# Patient Record
Sex: Female | Born: 1990 | Race: White | Hispanic: No | Marital: Single | Smoking: Never smoker
Health system: Southern US, Community
[De-identification: ages and names within clinical notes are randomized; demographics above are authoritative.]

## PROBLEM LIST (undated history)

## (undated) ENCOUNTER — Inpatient Hospital Stay (HOSPITAL_COMMUNITY): Payer: Self-pay

## (undated) DIAGNOSIS — F329 Major depressive disorder, single episode, unspecified: Secondary | ICD-10-CM

## (undated) DIAGNOSIS — Z803 Family history of malignant neoplasm of breast: Secondary | ICD-10-CM

## (undated) DIAGNOSIS — F32A Depression, unspecified: Secondary | ICD-10-CM

## (undated) DIAGNOSIS — F419 Anxiety disorder, unspecified: Secondary | ICD-10-CM

## (undated) DIAGNOSIS — J45909 Unspecified asthma, uncomplicated: Secondary | ICD-10-CM

## (undated) HISTORY — PX: WISDOM TOOTH EXTRACTION: SHX21

## (undated) HISTORY — DX: Family history of malignant neoplasm of breast: Z80.3

---

## 2012-07-03 DIAGNOSIS — J45909 Unspecified asthma, uncomplicated: Secondary | ICD-10-CM | POA: Insufficient documentation

## 2016-11-19 LAB — OB RESULTS CONSOLE HEPATITIS B SURFACE ANTIGEN: HEP B S AG: NEGATIVE

## 2016-11-19 LAB — OB RESULTS CONSOLE ABO/RH: RH Type: POSITIVE

## 2016-11-19 LAB — OB RESULTS CONSOLE RPR: RPR: NONREACTIVE

## 2016-11-19 LAB — OB RESULTS CONSOLE GC/CHLAMYDIA
Chlamydia: NEGATIVE
GC PROBE AMP, GENITAL: NEGATIVE

## 2016-11-19 LAB — OB RESULTS CONSOLE HIV ANTIBODY (ROUTINE TESTING): HIV: NONREACTIVE

## 2016-11-19 LAB — OB RESULTS CONSOLE RUBELLA ANTIBODY, IGM: RUBELLA: IMMUNE

## 2016-11-19 LAB — OB RESULTS CONSOLE ANTIBODY SCREEN: ANTIBODY SCREEN: NEGATIVE

## 2016-11-25 ENCOUNTER — Other Ambulatory Visit: Payer: Self-pay

## 2016-12-11 ENCOUNTER — Other Ambulatory Visit: Payer: Self-pay

## 2016-12-11 ENCOUNTER — Encounter (HOSPITAL_COMMUNITY): Payer: Self-pay

## 2016-12-13 ENCOUNTER — Other Ambulatory Visit (HOSPITAL_COMMUNITY): Payer: Self-pay | Admitting: Obstetrics and Gynecology

## 2016-12-13 DIAGNOSIS — Z3A14 14 weeks gestation of pregnancy: Secondary | ICD-10-CM

## 2016-12-13 DIAGNOSIS — D181 Lymphangioma, any site: Secondary | ICD-10-CM

## 2016-12-13 DIAGNOSIS — Z3689 Encounter for other specified antenatal screening: Secondary | ICD-10-CM

## 2016-12-16 ENCOUNTER — Encounter (HOSPITAL_COMMUNITY): Payer: Self-pay | Admitting: Obstetrics and Gynecology

## 2016-12-20 ENCOUNTER — Encounter (HOSPITAL_COMMUNITY): Payer: Self-pay | Admitting: *Deleted

## 2016-12-24 ENCOUNTER — Ambulatory Visit (HOSPITAL_COMMUNITY)
Admission: RE | Admit: 2016-12-24 | Discharge: 2016-12-24 | Disposition: A | Discharge status: Home / Self Care | Payer: BLUE CROSS/BLUE SHIELD | Source: Ambulatory Visit | Attending: Obstetrics and Gynecology | Admitting: Obstetrics and Gynecology

## 2016-12-24 ENCOUNTER — Encounter (HOSPITAL_COMMUNITY): Payer: Self-pay

## 2016-12-24 DIAGNOSIS — Z3A14 14 weeks gestation of pregnancy: Secondary | ICD-10-CM | POA: Diagnosis not present

## 2016-12-24 DIAGNOSIS — O283 Abnormal ultrasonic finding on antenatal screening of mother: Secondary | ICD-10-CM

## 2016-12-24 DIAGNOSIS — Z3689 Encounter for other specified antenatal screening: Secondary | ICD-10-CM

## 2016-12-24 DIAGNOSIS — O09892 Supervision of other high risk pregnancies, second trimester: Secondary | ICD-10-CM | POA: Insufficient documentation

## 2016-12-24 DIAGNOSIS — D181 Lymphangioma, any site: Secondary | ICD-10-CM

## 2016-12-24 DIAGNOSIS — O98412 Viral hepatitis complicating pregnancy, second trimester: Secondary | ICD-10-CM | POA: Diagnosis not present

## 2016-12-24 DIAGNOSIS — F1990 Other psychoactive substance use, unspecified, uncomplicated: Secondary | ICD-10-CM | POA: Diagnosis not present

## 2016-12-24 DIAGNOSIS — Z363 Encounter for antenatal screening for malformations: Secondary | ICD-10-CM | POA: Diagnosis not present

## 2016-12-24 DIAGNOSIS — B182 Chronic viral hepatitis C: Secondary | ICD-10-CM | POA: Diagnosis not present

## 2016-12-24 DIAGNOSIS — O99322 Drug use complicating pregnancy, second trimester: Secondary | ICD-10-CM | POA: Diagnosis present

## 2016-12-24 DIAGNOSIS — O358XX0 Maternal care for other (suspected) fetal abnormality and damage, not applicable or unspecified: Secondary | ICD-10-CM | POA: Insufficient documentation

## 2016-12-24 HISTORY — DX: Depression, unspecified: F32.A

## 2016-12-24 HISTORY — DX: Anxiety disorder, unspecified: F41.9

## 2016-12-24 HISTORY — DX: Unspecified asthma, uncomplicated: J45.909

## 2016-12-24 HISTORY — DX: Major depressive disorder, single episode, unspecified: F32.9

## 2016-12-26 DIAGNOSIS — Z3A14 14 weeks gestation of pregnancy: Secondary | ICD-10-CM | POA: Insufficient documentation

## 2016-12-26 DIAGNOSIS — O283 Abnormal ultrasonic finding on antenatal screening of mother: Secondary | ICD-10-CM | POA: Insufficient documentation

## 2016-12-26 NOTE — Progress Notes (Signed)
Genetic Counseling  High-Risk Gestation Note  Appointment Date:  12/24/2016 Referred By: Tyson Dense, * Date of Birth:  1991-03-16 Partner:  Gaye Alken   Pregnancy History: G1P0 Estimated Date of Delivery: 06/21/17 Estimated Gestational Age: 70w3dAttending: PBenjaman Lobe MD   I met with Ms. Deborah Stiverand her partner, Mr. JGaye Alken for genetic counseling because of abnormal ultrasound findings. The patient's mother also accompanied her to today's visit.    In summary:  Discussed ultrasound findings in detail: NT measurement increased today (5.2 mm), no hydrops visualized today  Reviewed associations with fetal aneuploidy, other chromosome aberrations, single gene conditions, structural defects, variant of normal  Reviewed options for additional screening  NIPS- Panorama performed through her OB office, results pending at time of today's appointment  [facility]ch[facility]- [facility]  Ongoing ultrasound- follow-up scheduled 01/14/17  NIPS single gene panel ([facility])- declined at this time, patient may consider pending Panorama result  Expanded pan-ethnic carrier screening panel- declined at this time, may consider after Panorama result obtained  Reviewed options for diagnostic testing, including risks, benefits, limitations and alternatives- declined amniocentesis at this time  Reviewed other explanations for ultrasound findings  Reviewed family history concerns  We began by reviewing the ultrasound in detail. The patient had a nuchal translucency (NT) ultrasound at the referring provider's office, which revealed an increased NT measurement/small cystic hygroma. Follow-up ultrasound performed today visualized increased NT measurement of 5.2 mm. It does not extend the full length of the fetal back; some possible septations were visualized. No evidence of hydrops visualized today. Complete ultrasound report under separate cover.    We discussed that the fetal NT refers to a fluid filled space between the skin and soft tissues behind the cervical spine. This space is traditionally measurable between 11 and 13.[redacted] weeks gestation and is considered enlarged when the measurement is equal to or greater than the 95th percentile for the gestational age. This couple was counseled regarding the various common etiologies for an enlarged NT including: aneuploidy, single gene conditions, cardiac or great vessel abnormalities, lymphatic system failure, decreased fetal movement, and fetal anemia.   We reviewed chromosomes, nondisjunction, and the common features and prognoses of Down syndrome and other aneuploidies. Considering the specific NT measurement, the risk for fetal aneuploidy is estimated to be approximately 33%. They were then counseled regarding the availability of aneuploidy screening options. The patient had noninvasive prenatal screening (NIPS)/prenatal cell free DNA testing performed through her OB office. We reviewed the methodology of this testing, Panorama through [facility] the conditions for which it assesses and detection rates and false positive rates of each. The results are currently pending. We reviewed that while highly sensitive and specific, it is not diagnostic and does not assess for all chromosome conditions.   We spent time discussing the chance for other chromosome aberrations, such as deletions and duplications. We discussed the diagnostic testing option of amniocentesis for karyotype and/or chromosome microarray analysis, including the risks, benefits, and limitations. We reviewed the associated risks for complications, including spontaneous pregnancy loss.   We also discussed single gene conditions. They were counseled that an increased NT is associated with an increased chance for specific single gene conditions including Noonan spectrum disorders, skeletal dysplasias, SLOS, CdLS, and many others. Single gene  conditions can follow various patterns of inheritance including autosomal recessive, X-linked, autosomal dominant and occur sporadically due to de novo gene variants. We discussed that these conditions are not routinely tested for prenatally unless ultrasound  findings or family history significantly increase the suspicion of a specific single gene disorder; however, there is new noninvasive prenatal screening (NIPS) technology ([facility] through [employer]) which assesses for specific alterations in 30 genes (covering 26 single gene conditions), including the genes associated with Noonan syndrome, some skeletal dysplasias, and CdLS. Most of these conditions follow an autosomal dominant pattern of inheritance and typically occur due to de novo gene mutations. The detection rates for these conditions vary depending upon the specific condition but range from 43% to 96%. Therefore, this screening would not identify all new dominant gene mutations.  We reviewed the benefits, limitations, and cost of this technology. They understand that many of the features of these conditions can be detected by detailed ultrasound during the second trimester; however, ultrasound cannot definitively diagnose or rule out these conditions. We briefly reviewed common inheritance patterns (dominant, recessive, and X-linked) as well as the associated risks of recurrence.   Regarding single gene conditions, we also discussed the more indirect screening option of expanded pan-ethnic carrier screening for a select panel of autosomal recessive and X-linked conditions. We reviewed that ACOG currently recommends that all patients be offered carrier screening for cystic fibrosis, spinal muscular atrophy and hemoglobinopathies. In addition, they were counseled that there are a variety of genetic screening laboratories that have pan-ethnic, or expanded, carrier screening panels, which evaluate carrier status for a wide range of genetic  conditions. We discussed that some of the conditions included on the panel, such as SLOS, can be associated with increased NT, while other conditions on the panel would not necessarily be related to the specific ultrasound findings in the pregnancy. Some of these conditions are severe and actionable, but also rare; others occur more commonly, but are less severe. We discussed that testing options range from screening for a single condition to panels of more than 175 autosomal or X-linked genetic conditions. We reviewed that the prevalence of each condition varies (and often varies with ethnicity). Thus the couples' background risk to be a carrier for each of these various conditions would range, and in some cases be very low or unknown. Similarly, the detection rate varies with each condition and also varies in some cases with ethnicity. We reviewed that a negative carrier screen would thus reduce, but not eliminate the chance to be a carrier for these conditions. For some conditions included on specific pan-ethnic carrier screening panels, the pre-test carrier frequency and/or the detection rate is unknown. We reviewed that in the event that one partner is found to be a carrier for one or more conditions, carrier screening would be available to the partner for those conditions. When both parents are identified to be carriers of a condition, prenatal diagnosis via amniocentesis would be available.   We discussed the possible results that the tests might provide including: positive, negative, unanticipated, and no result. Finally, they were counseled regarding the cost of each option and potential out of pocket expenses. After consideration of all the options, Ms. Dame declined amniocentesis, [facility] (single gene NIPS), and expanded pan-ethnic carrier screening panel (including ACOG recommended screening) at this time. She indicated that she would possibly consider pursuing these additional screens pending the  results of Panorama and planned to follow-up with her OB once these results are available.   In addition, we discussed that an NT of 5.2 mm is associated with an approximate 7% risk for a fetal cardiac anomaly. We discussed the options of a fetal echocardiogram and detailed anatomy ultrasound as  methods to evaluate the fetal heart. A fetal echocardiogram was ordered today and scheduled for 02/14/17 with [facility]. Detailed ultrasound is scheduled for 01/14/17.   We then discussed that an increased NT value can be a normal variant, which can resolve during the pregnancy. They were counseled that the fetal prognosis depends on the underlying etiology of the enlarged NT and further anticipatory guidance can be provided if a diagnosis is discovered. They understand that the legal limit for TAB in [place] is [redacted] weeks gestation.  Both family histories were reviewed and found to be noncontributory for birth defects, intellectual disability, and known genetic conditions. Deborah Hansen does not have information regarding her extended paternal family history, given that her father was adopted. Mr. Gershon Cull had to leave the appointment to go to work today prior to obtaining detailed family history information from him.  Without further information regarding the provided family history, an accurate genetic risk cannot be calculated. Further genetic counseling is warranted if more information is obtained.  Ms. Giliana Vantil was provided with written information regarding cystic fibrosis (CF), spinal muscular atrophy (SMA) and hemoglobinopathies including the carrier frequency, availability of carrier screening and prenatal diagnosis if indicated.  In addition, we discussed that CF and hemoglobinopathies are routinely screened for as part of the [place] newborn screening panel.  After further discussion, she declined screening for CF, SMA and hemoglobinopathies today.   Ms. Evyn Kooyman denied exposure  to environmental toxins or chemical agents. She denied the use of alcohol, tobacco or street drugs. She denied significant viral illnesses during the course of her pregnancy. 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 She also take methadone. The patient reported that all of these medications are monitored by various health professionals. Use of Klonopin (clonazepam) during pregnancy is not anticipated to increase the risk for birth defects according to available animal studies and human pregnancy experience. Risk of mild transient neonatal complications may be increased when clonazepam is taken in combination with selective serotonin reuptake inhibitors. Experimental animal studies and limited human reports regarding prenatal use of cymbalta (duloxetine) do not indicate an increased risk for birth defects above the general population risk. Use of serotonin reuptake inhibitors in the third trimester have been associated with an increased risk for transient neonatal syndrome of central nervous system including irritability, vomiting, jitteriness and convulsions, as well as neonatal pulmonary hypertension.  These associated symptoms typically do not appear to occur frequently or severely.  She should not alter her medication use without consulting with her physician first.  I counseled this couple regarding the above risks and available options. Most of the counseling was provided by Noland Fordyce, [facility] genetic counseling student, under my direct supervision. The approximate face-to-face time with the genetic counselor was 50 minutes.  Chipper Oman, MS Certified Genetic Counselor 12/26/2016   Addendum: Lucina Mellow results were received on 12/26/2016 from the patient's primary OB office. These are within normal limits, showing a less than 1 in 10,000 risk for trisomies 21, 18 and 13, and monosomy X (Turner  syndrome).  In addition, the risk for triploidy and sex chromosome trisomies (47,XXX and 47,XXY) was also low risk.  This testing identifies > 99% of pregnancies with trisomy 72, trisomy 41, sex chromosome trisomies (47,XXX and 47,XXY), and triploidy. The detection rate for trisomy 18 is 96%.  The detection rate for monosomy X is ~92%.  The false positive rate is <0.1%  for all conditions. Testing was also consistent with female fetal sex. The report indicates that the patient's OB communicated these results to her.

## 2017-01-02 ENCOUNTER — Other Ambulatory Visit (HOSPITAL_COMMUNITY): Payer: Self-pay | Admitting: *Deleted

## 2017-01-02 DIAGNOSIS — O283 Abnormal ultrasonic finding on antenatal screening of mother: Secondary | ICD-10-CM

## 2017-01-14 ENCOUNTER — Ambulatory Visit (HOSPITAL_COMMUNITY)
Admission: RE | Admit: 2017-01-14 | Discharge: 2017-01-14 | Disposition: A | Discharge status: Home / Self Care | Payer: BLUE CROSS/BLUE SHIELD | Source: Ambulatory Visit | Attending: Obstetrics and Gynecology | Admitting: Obstetrics and Gynecology

## 2017-01-14 ENCOUNTER — Encounter (HOSPITAL_COMMUNITY): Payer: Self-pay

## 2017-01-14 ENCOUNTER — Other Ambulatory Visit (HOSPITAL_COMMUNITY): Payer: Self-pay | Admitting: Maternal and Fetal Medicine

## 2017-01-14 DIAGNOSIS — Z3A17 17 weeks gestation of pregnancy: Secondary | ICD-10-CM

## 2017-01-14 DIAGNOSIS — O283 Abnormal ultrasonic finding on antenatal screening of mother: Secondary | ICD-10-CM | POA: Diagnosis present

## 2017-02-04 ENCOUNTER — Telehealth (HOSPITAL_COMMUNITY): Payer: Self-pay | Admitting: MS"

## 2017-02-04 NOTE — Telephone Encounter (Signed)
Attempted to contact patient regarding results of single gene panel NIPS performed ([facility]), which was within normal limits. Left message for patient to return call.   Santiago Glad Charo Philipp 02/04/2017 3:52 PM

## 2017-02-07 ENCOUNTER — Telehealth (HOSPITAL_COMMUNITY): Payer: Self-pay | Admitting: MS"

## 2017-02-07 NOTE — Telephone Encounter (Signed)
[name] returned call. Patient identified by name and DOB. Discussed with Ms. Luwana Butrick that I was calling regarding results of [facility], single gene noninvasive prenatal screening that was drawn given abnormal ultrasound findings. We discussed that this was within normal limits, negative for known pathogenic or likely pathogenic variants in the panel of 30 selected genes. The conditions selected for this panel are autosomal dominant or X-linked conditions that typically occur due to de novo gene variations. We reviewed that the detection rate ranges from 43% to greater than 96%, depending upon the specific condition and specific gene. Thus, this screen does not diagnose or rule out all cases of the genetic conditions included on this panel. The patient understands that it also does not screen for all genetic conditions. All questions answered to her satisfaction at this time.   Santiago Glad Argel Pablo 02/07/2017 4:02 PM

## 2017-02-10 ENCOUNTER — Other Ambulatory Visit: Payer: Self-pay

## 2017-02-11 ENCOUNTER — Other Ambulatory Visit (HOSPITAL_COMMUNITY): Payer: Self-pay | Admitting: Obstetrics and Gynecology

## 2017-02-11 DIAGNOSIS — O350XX0 Maternal care for (suspected) central nervous system malformation in fetus, not applicable or unspecified: Secondary | ICD-10-CM

## 2017-02-11 DIAGNOSIS — O3503X0 Maternal care for (suspected) central nervous system malformation or damage in fetus, choroid plexus cysts, not applicable or unspecified: Secondary | ICD-10-CM

## 2017-02-11 DIAGNOSIS — Z3A22 22 weeks gestation of pregnancy: Secondary | ICD-10-CM

## 2017-02-11 DIAGNOSIS — O358XX0 Maternal care for other (suspected) fetal abnormality and damage, not applicable or unspecified: Secondary | ICD-10-CM

## 2017-02-14 ENCOUNTER — Encounter (HOSPITAL_COMMUNITY): Payer: Self-pay

## 2017-02-14 ENCOUNTER — Ambulatory Visit (HOSPITAL_COMMUNITY)
Admission: RE | Admit: 2017-02-14 | Discharge: 2017-02-14 | Disposition: A | Discharge status: Home / Self Care | Payer: BLUE CROSS/BLUE SHIELD | Source: Ambulatory Visit | Attending: Obstetrics and Gynecology | Admitting: Obstetrics and Gynecology

## 2017-02-21 ENCOUNTER — Encounter (HOSPITAL_COMMUNITY): Payer: Self-pay

## 2017-05-26 ENCOUNTER — Inpatient Hospital Stay (HOSPITAL_COMMUNITY)
Admission: AD | Admit: 2017-05-26 | Discharge: 2017-05-26 | Disposition: A | Discharge status: Other Acute Inpatient Hospital | Payer: BLUE CROSS/BLUE SHIELD | Source: Ambulatory Visit | Attending: Obstetrics and Gynecology | Admitting: Obstetrics and Gynecology

## 2017-05-26 DIAGNOSIS — R030 Elevated blood-pressure reading, without diagnosis of hypertension: Secondary | ICD-10-CM | POA: Diagnosis not present

## 2017-05-26 DIAGNOSIS — Q249 Congenital malformation of heart, unspecified: Secondary | ICD-10-CM | POA: Insufficient documentation

## 2017-05-26 DIAGNOSIS — F1120 Opioid dependence, uncomplicated: Secondary | ICD-10-CM | POA: Insufficient documentation

## 2017-05-26 DIAGNOSIS — O358XX0 Maternal care for other (suspected) fetal abnormality and damage, not applicable or unspecified: Secondary | ICD-10-CM | POA: Insufficient documentation

## 2017-05-26 DIAGNOSIS — O26893 Other specified pregnancy related conditions, third trimester: Secondary | ICD-10-CM | POA: Insufficient documentation

## 2017-05-26 DIAGNOSIS — O99323 Drug use complicating pregnancy, third trimester: Secondary | ICD-10-CM | POA: Diagnosis not present

## 2017-05-26 DIAGNOSIS — Z3A36 36 weeks gestation of pregnancy: Secondary | ICD-10-CM | POA: Insufficient documentation

## 2017-05-26 NOTE — MAU Note (Signed)
G1P0 at 36.2 weeks co regular ctx q 10 min.  Possible LOF, positive FM, no VB.  Pt reports 2 increase BP in OB office, Md had had discussion on possible IOL with pt.  NO other medical or OB concerns.

## 2017-05-26 NOTE — MAU Note (Signed)
Urine in the lab  

## 2017-05-26 NOTE — H&P (Signed)
Deborah Hansen is a 26 y.o. female G1 @ 36+4 wks presenting with c/o contractions.  Contractions began last night but increased today.  Did c/o leaking fluid but fern slide negaitve per RN report.   No VB.   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   OB History    Gravida Para Term Preterm AB Living   1             SAB TAB Ectopic Multiple Live Births                 Past Medical History:  Diagnosis Date  . Anxiety   . Asthma   . Depression    Past Surgical History:  Procedure Laterality Date  . WISDOM TOOTH EXTRACTION     Family History: family history is not on file. Social History:  reports that she has never smoked. She has never used smokeless tobacco. She reports that she drinks alcohol. She reports that she uses drugs.     Maternal Diabetes: No - but pt refused 1hr gtt Maternal Ultrasounds/Referrals: Abnormal:  Findings:   Fetal Heart Anomalies Fetal Ultrasounds or other Referrals:  Fetal echo Maternal Substance Abuse:  Yes:  Type: Methadone Significant Maternal Medications:  Meds include: Other: cymbalta Significant Maternal Lab Results:  None Other Comments:  None  ROS History Dilation: 4.5 Effacement (%): 90 Station: -2 Exam by:: MLYYT RN Blood pressure (!) 142/101, pulse 97, temperature 98.1 F (36.7 C), temperature source Oral, last menstrual period 08/30/2016. Exam Physical Exam  Gen - NAD Abd - gravid, NT Ext - no edema, no clonus Cvx 4.5 cm per RN exam Prenatal labs: ABO, Rh: O/Positive/-- (02/06 0000) Antibody: Negative (02/06 0000) Rubella: Immune (02/06 0000) RPR: Nonreactive (02/06 0000)  HBsAg: Negative (02/06 0000)  HIV: Non-reactive (02/06 0000)  GBS:     Assessment/Plan: 36+ weeks with preterm contractions & elevated BP It has been recommended that patient deliver @ [facility] because of fetal cardiac malformation Dr Jeronimo Norma accepts transfer to [facility] Will contact [facility] for  transfer   [place] 05/26/2017, 6:07 PM

## 2017-09-13 ENCOUNTER — Encounter (HOSPITAL_COMMUNITY): Payer: Self-pay

## 2017-10-31 DIAGNOSIS — F39 Unspecified mood [affective] disorder: Secondary | ICD-10-CM | POA: Insufficient documentation

## 2017-10-31 DIAGNOSIS — F411 Generalized anxiety disorder: Secondary | ICD-10-CM | POA: Insufficient documentation

## 2018-10-20 IMAGING — US US MFM OB COMP +14 WKS
1 series · 14 of 28 positions shown · non-contrast
Comparison: none

[Series 1: us mfm ob comp +14 wks · 40 acquisitions, 14 frames shown]
[im 2/40]
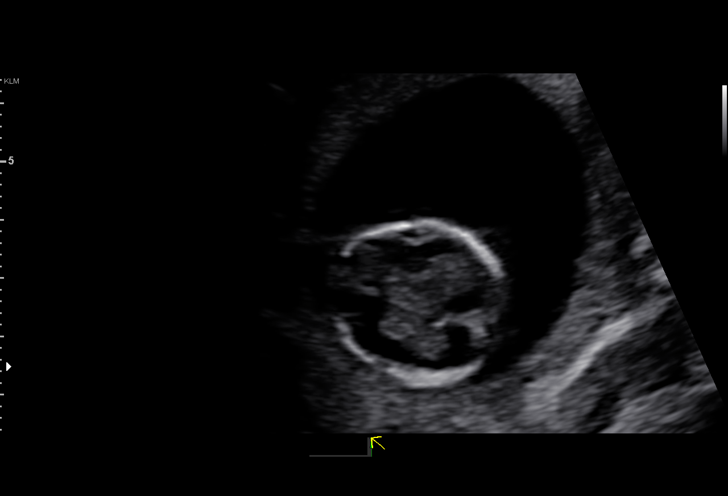
[im 5/40]
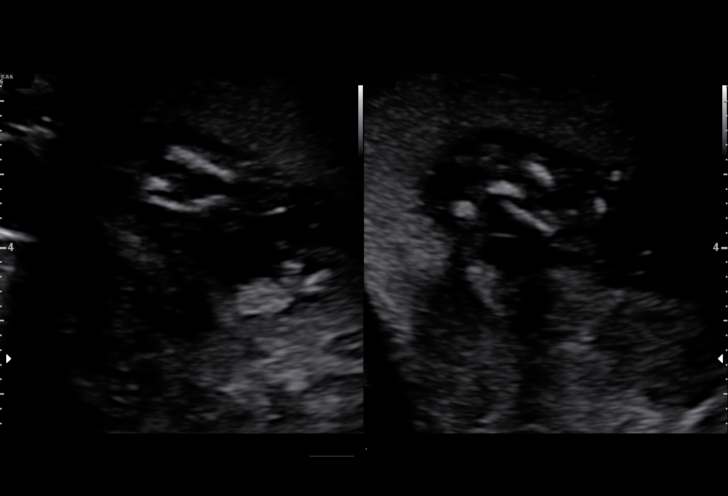
[im 8/40]
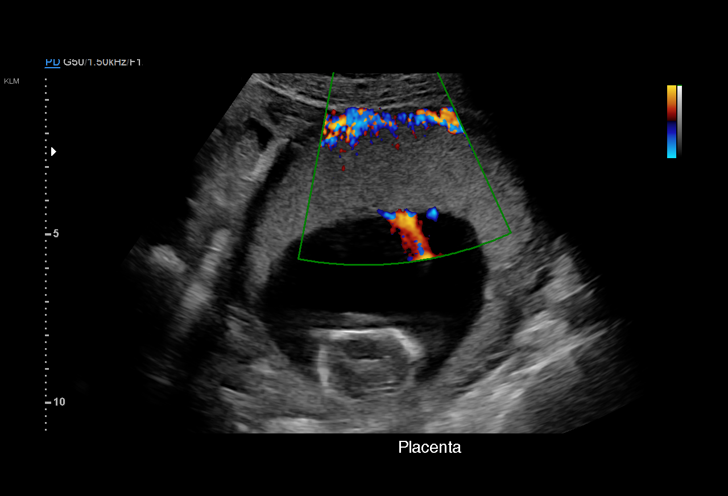
[im 11/40]
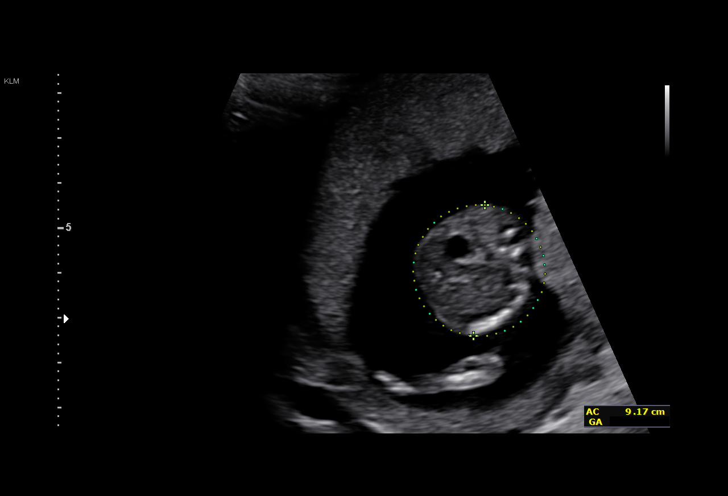
[im 14/40]
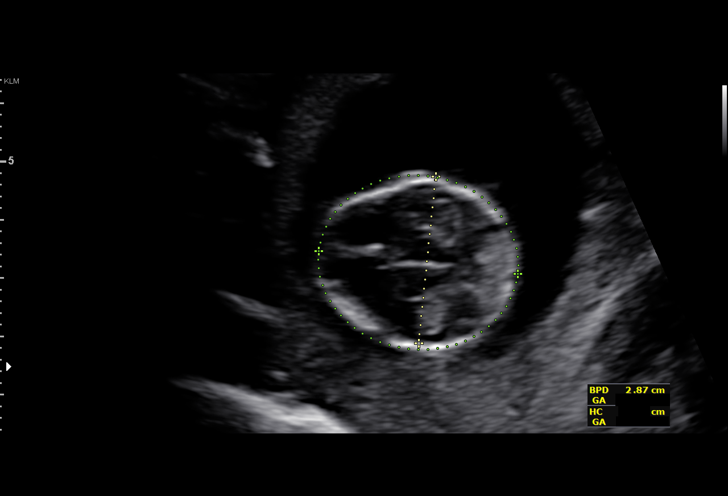
[im 16/40]
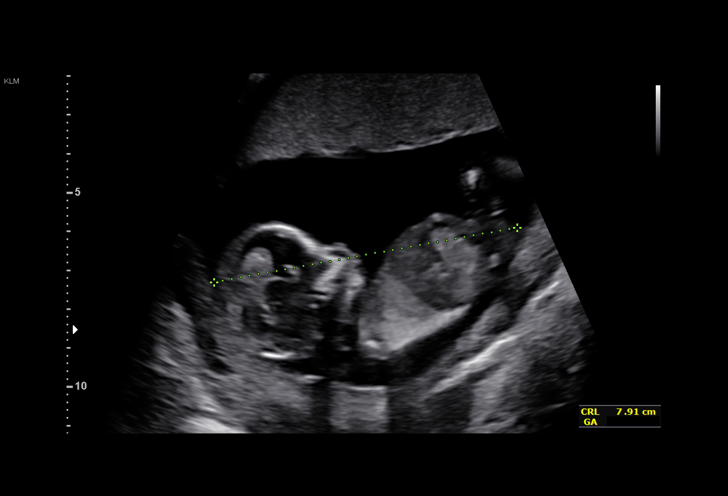
[im 19/40]
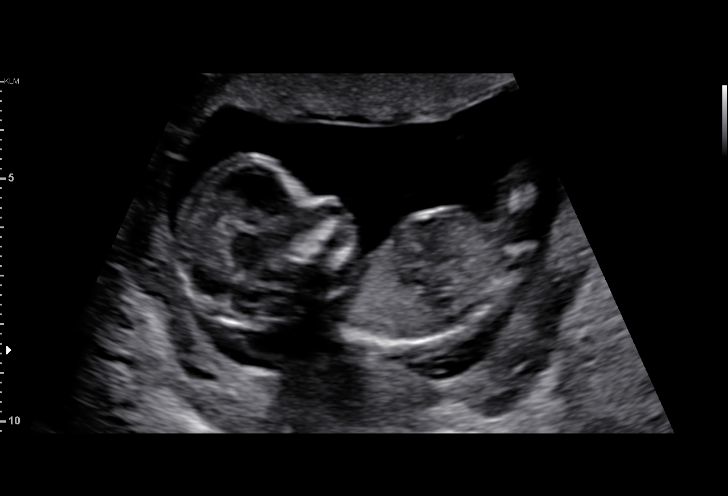
[im 22/40]
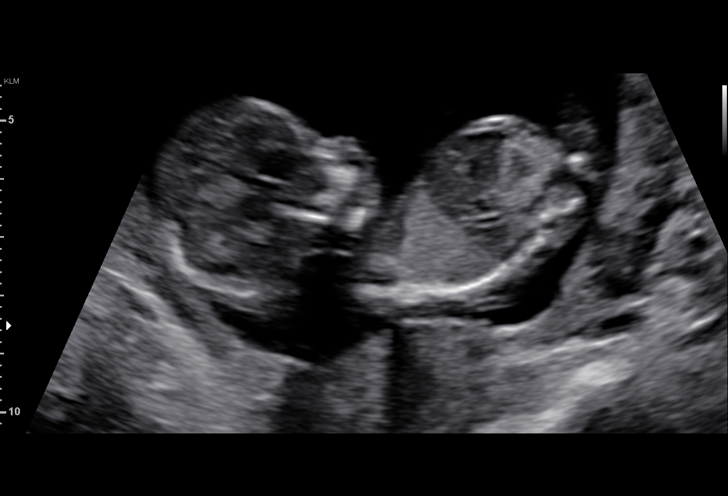
[im 25/40]
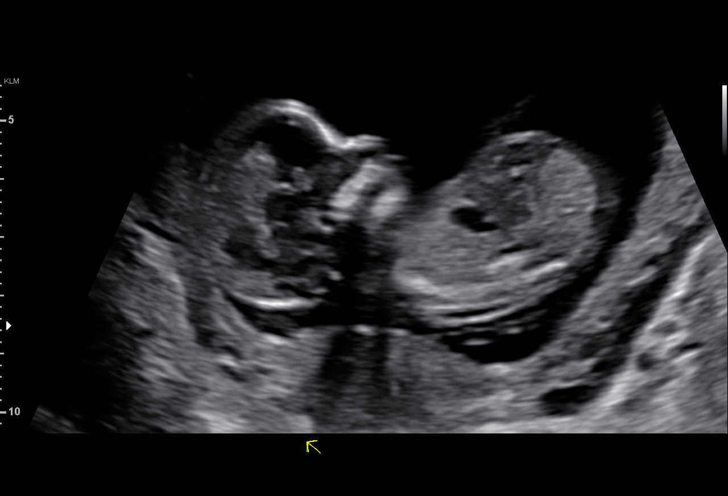
[im 28/40]
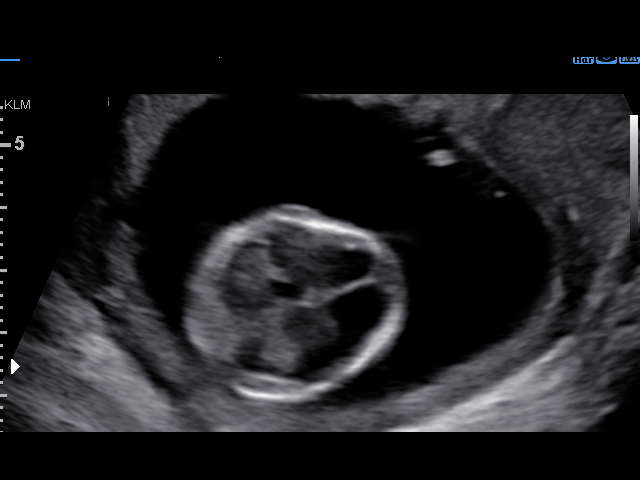
[im 31/40]
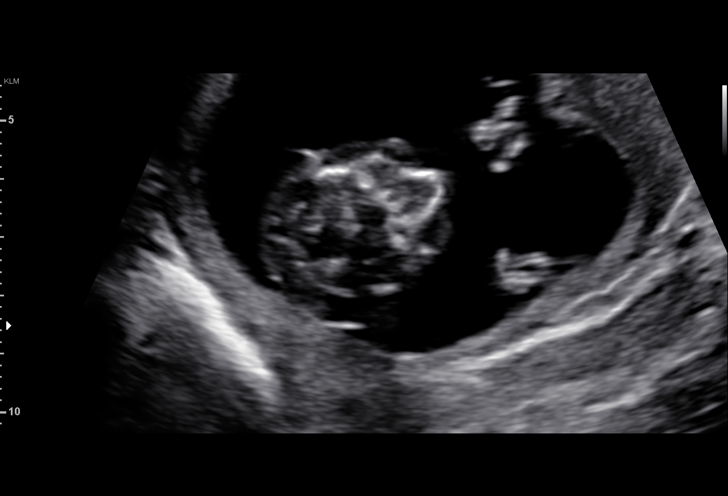
[im 34/40]
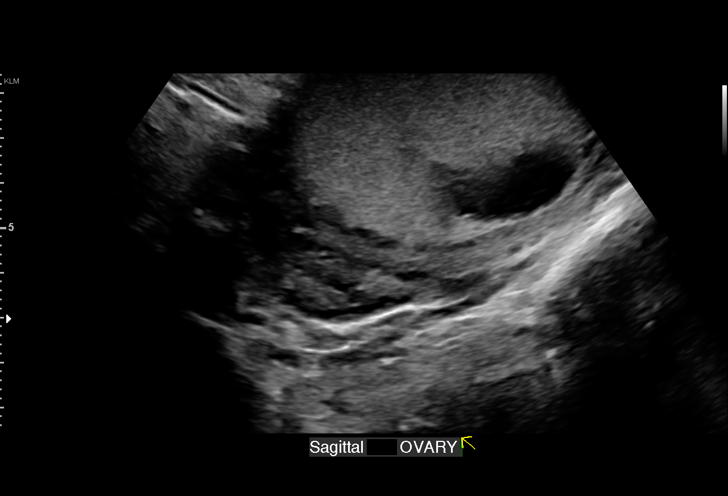
[im 37/40]
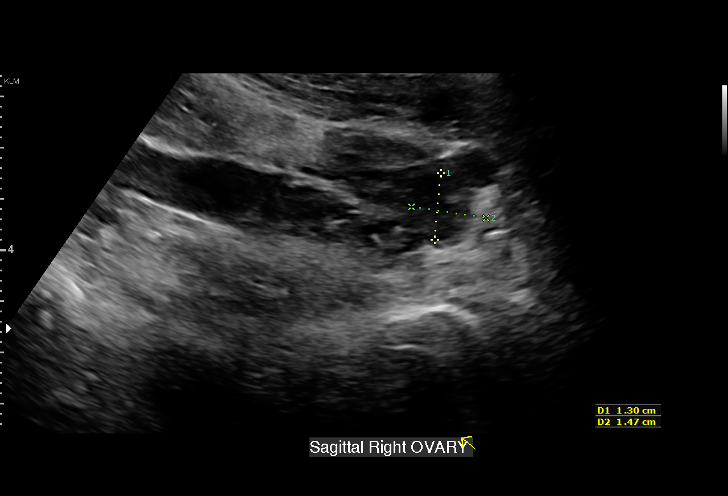
[im 40/40]
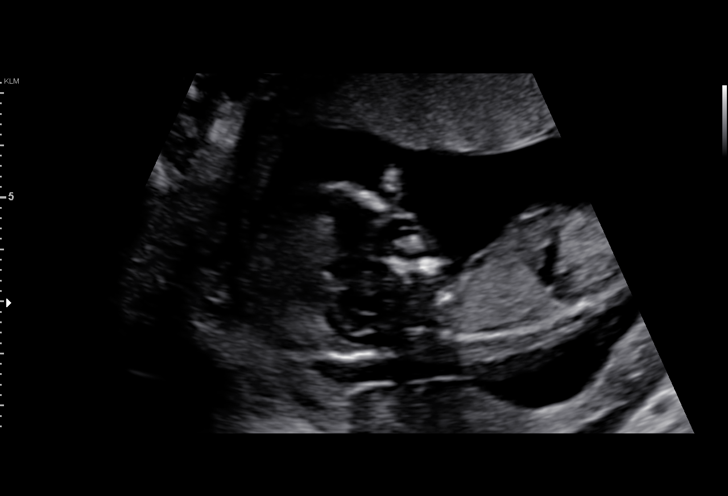

[14 of 28 positions shown; findings below may reference images not displayed]

Terrace

1  KUHAA [id number]              [id number]      [id number]     [id number]
Indications

14 weeks gestation of pregnancy
Cystic hygroma
High risk pregnancy due to maternal drug
abuse (methadone)
Chronic Hepatitis C complicating pregnancy,
antepartum
Encounter for antenatal screening for
malformations
OB History

Gravidity:    1         Term:   0        Prem:   0        SAB:   0
TOP:          0       Ectopic:  0        Living: 0
Fetal Evaluation

Num Of Fetuses:     1
Fetal Heart         164
Rate(bpm):
Cardiac Activity:   Observed
Presentation:       Variable
Placenta:           Anterior, above cervical os
P. Cord Insertion:  Visualized

Amniotic Fluid
AFI FV:      Subjectively within normal limits
Biometry

CRL:      79.1  mm     G. Age:  13w 4d                  EDD:   06/27/17

BPD:      28.7  mm     G. Age:  15w 1d                  CI:        80.55   %   70 - 86
FL/HC:      12.4   %
HC:       101   mm     G. Age:  14w 5d                  HC/AC:      1.10       1.14 -
AC:       91.7  mm     G. Age:  15w 3d                  FL/BPD:     43.6   %
FL:       12.5  mm     G. Age:  13w 5d                  FL/AC:      13.6   %   20 - 24

Est. FW:     101  gm      0 lb 4 oz
Gestational Age

U/S Today:     14w 5d                                        EDD:   06/19/17
Best:          14w 3d    Det. By:   Early Ultrasound         EDD:   06/21/17
(11/14/16)
Anatomy

Cranium:               Appears normal         Abdominal Wall:         Appears nml (cord
insert, abd wall)
Choroid Plexus:        Appears normal         Bladder:                Appears normal
Nuchal Fold:           Appears enlarged,      Upper Extremities:      Visualized
5.2 mm
Stomach:               Appears normal, left   Lower Extremities:      Visualized
sided
Abdomen:               Appears normal

Other:  Thickened Nuchal Transluency: 5mm
Cervix Uterus Adnexa

Cervix
CLosed

Uterus
No abnormality visualized.

Left Ovary
Within normal limits.

Right Ovary
Within normal limits.

Cul De Sac:   No free fluid seen.

Adnexa:       No abnormality visualized.
Impression

Single IUP at 14w 3d
A thickened NT  vs. small cystic hygroma is noted - measures
5.2 mm.
Does not appear to extend the full length of the back - some
possible septations are seen
No fetal hydrops is appreciated
The remainder of the fetal anatomy is limited due to early
gestational age

Ultrasound findings and limitations were discussed in detail.
Cell free fetal DNA was drawn but was pending at the time of
evaluation.  Briefly discussed the association of aneuploidy /
45X as well as congenital heart defects which are associated
with this finding.  In the absence of hydrops, would expect a
more favorable prognosis for the fetus
Recommendations

See separate note from Genetic counselor
If thepatient elects to undergo amniocentesis plan follow up
16 weeks for this procedure
Ultrasound for anatomy in 3 weeks
Fetal echo at 20-22 weeks.

## 2018-11-16 ENCOUNTER — Other Ambulatory Visit: Payer: Self-pay | Admitting: Family Medicine

## 2018-11-16 ENCOUNTER — Ambulatory Visit
Admission: RE | Admit: 2018-11-16 | Discharge: 2018-11-16 | Disposition: A | Discharge status: Home / Self Care | Payer: Medicaid Other | Source: Ambulatory Visit | Attending: Family Medicine | Admitting: Family Medicine

## 2018-11-16 DIAGNOSIS — M545 Low back pain, unspecified: Secondary | ICD-10-CM

## 2018-11-16 DIAGNOSIS — M549 Dorsalgia, unspecified: Secondary | ICD-10-CM

## 2019-04-26 ENCOUNTER — Other Ambulatory Visit: Payer: Self-pay

## 2019-04-26 ENCOUNTER — Ambulatory Visit: Payer: Medicaid Other | Attending: Adult Health

## 2019-04-26 DIAGNOSIS — M5441 Lumbago with sciatica, right side: Secondary | ICD-10-CM | POA: Insufficient documentation

## 2019-04-26 DIAGNOSIS — G8929 Other chronic pain: Secondary | ICD-10-CM | POA: Diagnosis present

## 2019-04-26 DIAGNOSIS — M62838 Other muscle spasm: Secondary | ICD-10-CM | POA: Diagnosis present

## 2019-04-26 NOTE — Therapy (Signed)
[facility] [address], [place], [address] Phone: [phone]   Fax:  [phone]  Physical Therapy Treatment  Patient Details  Name: Deborah Hansen MRN: [id number] Date of Birth: [birth year] Referring Provider (PT): Eda Paschal, NP   Encounter Date: 04/26/2019  PT End of Session - 04/26/19 1740    Visit Number  1    Number of Visits  8    Date for PT Re-Evaluation  06/27/19    Authorization Type   Medicaid    Authorization Time Period  04/26/19-06/27/19    Authorization - Visit Number  1    Authorization - Number of Visits  10    PT Start Time  4825   pt arrived late   PT Stop Time  1640    PT Time Calculation (min)  60 min    Equipment Utilized During Treatment  Gait belt    Activity Tolerance  Patient tolerated treatment well;No increased pain    Behavior During Therapy  WFL for tasks assessed/performed       Past Medical History:  Diagnosis Date  . Anxiety   . Asthma   . Depression     Past Surgical History:  Procedure Laterality Date  . WISDOM TOOTH EXTRACTION      There were no vitals filed for this visit.  Subjective Assessment - 04/26/19 1547    Subjective  Pt presents for PT evlauation for chornic low back pain >2 years, with more recent intermittent Rt sciatica symptoms. She is free of sciatica some days, but has aggravation with more physical exersion related to work and play of motherhood.    Pertinent History  Pt reports a history of chronic low back pain, which began with her last pregnancy >2YA, and then when her son was closer to 7-107m old, she began to notice a concomitant RLE  paresthesia from hip down to foot, sometimes with total foot numbness. She also noted increased stiffness in her low back. At one point had significant weakness and ease in fatigue with BLE, heaviness in legs, which limited activity. Pt also has difficulty with prolonged sitting such as in driving.  She is free of sciatica some days, but has aggravation with more physical exersion related to work and play of motherhood. Pt reports her symptoms are consistnetly related to her period, and she has much less symptoms the first week after her period. Symptoms are almost exclusively controlled through actvity/postural modification.    Limitations  Sitting    How long can you sit comfortably?  Limited to less than 60 minutes in the car.    How long can you stand comfortably?  10 minutes    How long can you walk comfortably?  not limited, frequently hikes/walks with her son    Diagnostic tests  xrays: pt reports nothing remarkable.    Patient Stated Goals  resolve pain and be able to play with her son without pain and limitations    Currently in Pain?  No/denies         [facility] PT Assessment - 04/26/19 0001      Assessment   Medical Diagnosis  Low back pain c sciatica    Referring Provider (PT)  Eda Paschal, NP    Onset Date/Surgical Date  --   end of 2018   Hand Dominance  Right    Next MD Visit  --   none scheduled, pending PT results.   Prior Therapy  None  Precautions   Precautions  None      Restrictions   Weight Bearing Restrictions  No      Balance Screen   Has the patient fallen in the past 6 months  No    Has the patient had a decrease in activity level because of a fear of falling?   No    Is the patient reluctant to leave their home because of a fear of falling?   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   -Light touch sensation, appears intact and equal bilat -Reflexes:  Bilat knee jerk 2+/4; Left ankle jerk 1+/4; Right ankle Jerk 2+/5 -Strength/MMT:   Ankle DF/PF strength = bilat 5/5; hamstrings 5/5 bilat (seated); Prone hip extension is 5/5 Left, 4/5 Right; Prone bent knee raise is 4/5 bilat  ROM: Lumbar flexion extension WNL, slight hypermobility noted, especially into flexion; slight extension limitation at upper lumbar region.  Lumbar screening for directional preference- repeated flexion in stand x15 without affect; repeated extension in prone, pt initially has some stiffness at L2/3 area, then cavitation and then feels improved overall; repeated press-up with overpressure results in abatement of right leg referral.  -PA spring testing; -Spring testing is negative in resting for pain provocation, L1-L5, positive for 'stiffness' ;noted hypomobility at L2/L3, also feels questionable for spinous process hypertrophy at these levels.  Lumbar Special Tests: -Slump Test: Negative Left side; positive right side for slight pulling sensation on Right near SIJ, and "stiffness in back" Pelvis Special Tests: -Sacral thrust is negative, pelvic compression is negative.  Muscle Length: Rt hamstrings length 144 degrees; Left 134 degrees  -Prone Rectus tightness onset: 20-30 degrees tighter on the Right side. *should be correlated with Right posterior innominate at future visits.  Other: Right sided piriformis spasm, negative on left  *Of note: Combination of greater Right hamstrings length and greater Right rectus femoris tightness should be correlated with potential Right posterior innominate although patient has a negative sacral thrust and denies history of pubic symphysis/vulva pain.        Treatment this session: -Prone repeated extension with manual resistance on L2/3: lumbar remains stiff, but preipheral symptoms completely resolve. (1x15) -Repeated Extension in standing with a gait belt at L2/3 level 1x15  -Education on avoiding prolonged flexion.  -piriformis release on Right x2 minutes        PT Education - 04/26/19 1736    Education  Details  Explained directional preference for extension; asked patient to manage limb symptoms with HEP, avoid flexion on painful days.    Person(s) Educated  Patient    Methods  Explanation;Demonstration;Handout    Comprehension  Verbalized understanding;Returned demonstration       PT Short Term Goals - 04/26/19 1745      PT SHORT TERM GOAL #1   Title  After 4 weeks patient will report inproved management of her back pain and RLE symptoms through use of HEP.    Baseline  minimal symptoms management    Time  4    Period  Weeks    Status  New    Target  Date  05/27/19      PT SHORT TERM GOAL #2   Title  After 4 weeks patient will report improved management of back pain in car and tolerance to driving up to 60 minutes without RLE symptoms.    Baseline  symptoms at 53minutes    Time  4    Period  Weeks    Status  New    Target Date  05/27/19        PT Long Term Goals - 04/26/19 1747      PT LONG TERM GOAL #1   Title  After 8 weeks patient will report improved ability to pick up son without limitations from back pain.    Baseline  pain with lots of lifting son    Time  8    Period  Weeks    Status  New    Target Date  06/27/19      PT LONG TERM GOAL #2   Title  After 8 weeks pt will demonstrate good form as instructed for squat and deadlift to improve ability to progress core strength required to perform parenting duties    Time  8    Period  Weeks    Status  New    Target Date  06/27/19      PT LONG TERM GOAL #3   Title  After 8 weeks patient will report back pain less than 2 days per week in order to better participate in IADL and household actiivty.    Baseline  disabling pain on busy days    Time  8    Period  Weeks    Status  New    Target Date  06/27/19            Plan - 04/26/19 1751    Clinical Impression Statement  Pt presents with complaint of chronic low back pain >2 years, worse at end of pregnancy, and with additional or intermittent right  sciatica component since her son was 50-8 months old. Pt reports symptoms fluctuation that worsen with activity, prolonged sitting, and physical caregiver duties. Pt also reports consistent fluctuation in symptoms with menstruation cycle which should be correlated with fluctuating changes in flexibility. Examination reveals intact light touch sensation, slightly decreased ankle jerk reflex, weakness in Right hip extension, spasms in the posterior hip/piriformis. Pt has limited mobility on the lumbar spine at the L2 and L3 levels which as 'stiff' and sore at end-range extension. Pt has improved symptoms and centralization phenomenon with extension based activity. Pt has a positive slump test, a screening tool to rule in lumbar discopathy. Screening of the SIJ bilat is negative. Pt will benefit from skill PT intervention to address deficits and impairment identified in this note to decrease back pain, eliminate RLE symptoms, educate patient on symptoms self management, and restore patient's ability to serve as a caregiver to her child without limitation.    Personal Factors and Comorbidities  Age;Fitness;Past/Current Experience;Social Background;Time since onset of injury/illness/exacerbation;Behavior Pattern    Examination-Activity Limitations  Squat;Caring for Others;Carry;Sit    Examination-Participation Restrictions  Cleaning;Shop;Meal Prep;Interpersonal Relationship    Stability/Clinical Decision Making  Stable/Uncomplicated    Clinical Decision Making  Moderate    Rehab Potential  Fair    PT Frequency  1x / week    PT Duration  8 weeks    PT Treatment/Interventions  ADLs/Self Care Home Management;Traction;Moist Heat;Electrical Stimulation;Functional mobility training;Therapeutic exercise;Dry needling;Passive range of motion;Manual techniques;Patient/family education    PT Next Visit Plan  Assess symptoms and HEP affect; assess hip ROM bilat, trial sciatic flossing for Right leg; Assess squat and  deadlift form in context of patient lifting her child.    PT Home Exercise Plan  Prone pressup, repeated extension in standing with belt overpressure    Consulted and Agree with Plan of Care  Patient       Patient will benefit from skilled therapeutic intervention in order to improve the following deficits and impairments:  Decreased activity tolerance, Hypomobility, Improper body mechanics, Impaired tone, Decreased strength, Postural dysfunction, Hypermobility  Visit Diagnosis: 1. Chronic midline low back pain with right-sided sciatica   2. Other muscle spasm        Problem List Patient Active Problem List   Diagnosis Date Noted  . Increased nuchal translucency space on fetal ultrasound 12/26/2016  . [redacted] weeks gestation of pregnancy    6:29 PM, 04/26/19 Etta Grandchild, PT, DPT Physical Therapist - [facility] [phone] (Office)    Etta Grandchild 04/26/2019, 6:11 PM  [facility] [address] [place], [address] Phone: [phone]   Fax:  [phone]  Name: Deborah Hansen MRN: [id number] Date of Birth: 1991/04/22

## 2019-05-04 ENCOUNTER — Ambulatory Visit: Payer: Medicaid Other

## 2019-05-05 ENCOUNTER — Telehealth: Payer: Self-pay

## 2019-05-05 NOTE — Telephone Encounter (Signed)
No show. Called patient and left a message pertaining to appointment and a reminder for the next follow up session. Return phone call requested. Phone number ([phone]) provided.   

## 2019-05-11 ENCOUNTER — Ambulatory Visit: Payer: Medicaid Other

## 2019-05-11 ENCOUNTER — Other Ambulatory Visit: Payer: Self-pay

## 2019-05-11 DIAGNOSIS — G8929 Other chronic pain: Secondary | ICD-10-CM

## 2019-05-11 DIAGNOSIS — M62838 Other muscle spasm: Secondary | ICD-10-CM

## 2019-05-11 DIAGNOSIS — M5441 Lumbago with sciatica, right side: Secondary | ICD-10-CM | POA: Diagnosis not present

## 2019-05-11 NOTE — Therapy (Signed)
[facility] [address] [place], [address] Phone: [phone]   Fax:  [phone]  Physical Therapy Treatment  Patient Details  Name: Deborah Hansen MRN: [id number] Date of Birth: 1990/12/06 Referring Provider (PT): Eda Paschal, NP   Encounter Date: 05/11/2019  PT End of Session - 05/11/19 1448    Visit Number  2    Number of Visits  8    Date for PT Re-Evaluation  06/27/19    Authorization Type  [place] Medicaid    Authorization Time Period  04/26/19-06/27/19    Authorization - Visit Number  2    Authorization - Number of Visits  10    PT Start Time  1540    PT Stop Time  0867    PT Time Calculation (min)  56 min    Equipment Utilized During Treatment  Gait belt    Activity Tolerance  Patient tolerated treatment well;No increased pain    Behavior During Therapy  WFL for tasks assessed/performed       Past Medical History:  Diagnosis Date  . Anxiety   . Asthma   . Depression     Past Surgical History:  Procedure Laterality Date  . WISDOM TOOTH EXTRACTION      There were no vitals filed for this visit.  Subjective Assessment - 05/11/19 1450    Subjective  Back is hurting a lot recently because she is supposed to start her period soon. Back feels really stiff. Back bothered her after last session. Felt a flare up R LE (sciatic distribution to R foot).    Pertinent History  Pt reports a history of chronic low back pain, which began with her last pregnancy >2YA, and then when her son was closer to 7-78m old, she began to notice a concomitant RLE  paresthesia from hip down to foot, sometimes with total foot numbness. She also noted increased stiffness in her low back. At one point had significant weakness and ease in fatigue with BLE, heaviness in legs, which limited activity. Pt also has difficulty with prolonged sitting such as in driving. She is free of sciatica some days, but has aggravation with  more physical exersion related to work and play of motherhood. Pt reports her symptoms are consistnetly related to her period, and she has much less symptoms the first week after her period. Symptoms are almost exclusively controlled through actvity/postural modification.    Limitations  Sitting    How long can you sit comfortably?  Limited to less than 60 minutes in the car.    How long can you stand comfortably?  10 minutes    How long can you walk comfortably?  not limited, frequently hikes/walks with her son    Diagnostic tests  xrays: pt reports nothing remarkable.    Patient Stated Goals  resolve pain and be able to play with her son without pain and limitations    Currently in Pain?  Other (Comment)   stiff                              PT Education - 05/11/19 1504    Education Details  ther-ex    Person(s) Educated  Patient    Methods  Explanation;Demonstration;Tactile cues;Verbal cues    Comprehension  Returned demonstration;Verbalized understanding      Objectives  Aggravating factors: picking up her son, carrying her son at her L hip,  driving, sitting for long periods of time, walking around the grocery store for about 1 hour. Static standing for 10 minutes.   4/5 R glute max strength   Medbridge Access Code: [email or web address]    Standing posture: Slight R lumbar rotation, R paraspinal muscle tension lumbar  R lateral shift.    Therapeutic exercise  (-) long sit test  Prone glute max set 10x5 seconds each side  Prone alternating scaption 10x5 seconds each   Prone alternating hip extension, leg straight 10x 5 seconds each   Prone alternating hip extension, contralateral shoulder scaption 10x2 with 5 second holds each side  Supine transversus abdominis contraction 10x2 with 5 second holds    Then with hip fallouts 10x  Standing R LE single leg deadlift with L UE assist 10x2  Second set with L foot on furniture slider and no L UE assist     Standing R lateral shift correction 10x5 seconds   Try physioball exercises next visit if appropriate    Improved exercise technique, movement at target joints, use of target muscles after mod verbal, visual, tactile cues.    Response to treatment Good muscle use felt during exercises. Pt tolerated session well without aggravation of symptoms.     Clinical impression Worked on core, and glute strengthening, posture, and lumbopelvic control to promote better mechanics at her low back. No complain of increased back pain throughout session. Pt will benefit from continued skilled physical therapy services to decrease pain, improve strength, function, and ability to play with her son more comfortably.         PT Short Term Goals - 04/26/19 1745      PT SHORT TERM GOAL #1   Title  After 4 weeks patient will report inproved management of her back pain and RLE symptoms through use of HEP.    Baseline  minimal symptoms management    Time  4    Period  Weeks    Status  New    Target Date  05/27/19      PT SHORT TERM GOAL #2   Title  After 4 weeks patient will report improved management of back pain in car and tolerance to driving up to 60 minutes without RLE symptoms.    Baseline  symptoms at 11minutes    Time  4    Period  Weeks    Status  New    Target Date  05/27/19        PT Long Term Goals - 04/26/19 1747      PT LONG TERM GOAL #1   Title  After 8 weeks patient will report improved ability to pick up son without limitations from back pain.    Baseline  pain with lots of lifting son    Time  8    Period  Weeks    Status  New    Target Date  06/27/19      PT LONG TERM GOAL #2   Title  After 8 weeks pt will demonstrate good form as instructed for squat and deadlift to improve ability to progress core strength required to perform parenting duties    Time  8    Period  Weeks    Status  New    Target Date  06/27/19      PT LONG TERM GOAL #3   Title  After 8  weeks patient will report back pain less than 2 days per week in order to better participate in IADL  and household actiivty.    Baseline  disabling pain on busy days    Time  8    Period  Weeks    Status  New    Target Date  06/27/19            Plan - 05/11/19 1726    Clinical Impression Statement  Worked on core, and glute strengthening, posture, and lumbopelvic control to promote better mechanics at her low back. No complain of increased back pain throughout session. Pt will benefit from continued skilled physical therapy services to decrease pain, improve strength, function, and ability to play with her son more comfortably.    Personal Factors and Comorbidities  Age;Fitness;Past/Current Experience;Social Background;Time since onset of injury/illness/exacerbation;Behavior Pattern    Examination-Activity Limitations  Squat;Caring for Others;Carry;Sit    Examination-Participation Restrictions  Cleaning;Shop;Meal Prep;Interpersonal Relationship    Stability/Clinical Decision Making  Stable/Uncomplicated    Rehab Potential  Fair    PT Frequency  1x / week    PT Duration  8 weeks    PT Treatment/Interventions  ADLs/Self Care Home Management;Traction;Moist Heat;Electrical Stimulation;Functional mobility training;Therapeutic exercise;Dry needling;Passive range of motion;Manual techniques;Patient/family education    PT Next Visit Plan  Assess symptoms and HEP affect; assess hip ROM bilat, trial sciatic flossing for Right leg; Assess squat and deadlift form in context of patient lifting her child.    PT Home Exercise Plan  Prone pressup, repeated extension in standing with belt overpressure    Consulted and Agree with Plan of Care  Patient       Patient will benefit from skilled therapeutic intervention in order to improve the following deficits and impairments:  Decreased activity tolerance, Hypomobility, Improper body mechanics, Impaired tone, Decreased strength, Postural dysfunction,  Hypermobility  Visit Diagnosis: 1. Chronic midline low back pain with right-sided sciatica   2. Other muscle spasm        Problem List Patient Active Problem List   Diagnosis Date Noted  . Increased nuchal translucency space on fetal ultrasound 12/26/2016  . [redacted] weeks gestation of pregnancy     Joneen Boers PT, DPT   05/11/2019, 5:37 PM  [facility] [address], [place], [address] Phone: [phone]   Fax:  [phone]  Name: Deborah Hansen MRN: [id number] Date of Birth: 03-21-1991

## 2019-05-11 NOTE — Patient Instructions (Addendum)
Medbridge Access Code: [email or web address]  Bent Knee Fallouts 10x3 for 3x/day    Standing right lateral shift correction   Standing next to a wall on your right side, right elbow bent,     Shift your right hip to the wall    Hold for 5 seconds     Repeat 10 times    Perform 3 sets for 3 times a day.

## 2019-05-18 ENCOUNTER — Ambulatory Visit: Payer: Medicaid Other | Attending: Adult Health

## 2019-05-18 ENCOUNTER — Telehealth: Payer: Self-pay

## 2019-05-18 DIAGNOSIS — G8929 Other chronic pain: Secondary | ICD-10-CM | POA: Insufficient documentation

## 2019-05-18 DIAGNOSIS — M62838 Other muscle spasm: Secondary | ICD-10-CM | POA: Insufficient documentation

## 2019-05-18 DIAGNOSIS — M5441 Lumbago with sciatica, right side: Secondary | ICD-10-CM | POA: Insufficient documentation

## 2019-05-18 NOTE — Telephone Encounter (Signed)
No show. Called patient and left a message pertaining to appointment. Return phone call requested. Phone number ([phone]) provided.   

## 2019-05-19 NOTE — Addendum Note (Signed)
Addended by: Etta Grandchild on: 05/19/2019 08:32 AM   Modules accepted: Orders

## 2019-05-25 ENCOUNTER — Other Ambulatory Visit: Payer: Self-pay

## 2019-05-25 ENCOUNTER — Ambulatory Visit: Payer: Medicaid Other

## 2019-05-25 DIAGNOSIS — G8929 Other chronic pain: Secondary | ICD-10-CM

## 2019-05-25 DIAGNOSIS — M62838 Other muscle spasm: Secondary | ICD-10-CM

## 2019-05-25 NOTE — Therapy (Signed)
[facility] [address], [place], [address] Phone: [phone]   Fax:  [phone]  Physical Therapy Treatment  Patient Details  Name: Deborah Hansen MRN: [id number] Date of Birth: [birth year] Referring Provider (PT): Eda Paschal, NP   Encounter Date: 05/25/2019   PT End of Session - 05/25/19 1507    Number of Visits  18    Date for PT Re-Evaluation  07/22/19    Authorization Type  [place] Medicaid    Authorization Time Period  04/26/19-06/27/19    Authorization - Number of Visits  --    PT Start Time  1441    PT Stop Time  1508    PT Time Calculation (min)  27 min    Equipment Utilized During Treatment  Gait belt    Activity Tolerance  Patient tolerated treatment well;No increased pain    Behavior During Therapy  WFL for tasks assessed/performed       Past Medical History:  Diagnosis Date  . Anxiety   . Asthma   . Depression     Past Surgical History:  Procedure Laterality Date  . WISDOM TOOTH EXTRACTION      There were no vitals filed for this visit.  Subjective Assessment - 05/25/19 1440    Subjective  Difficult to come to PT every week due to starting new job. Went hiking over the weekend and sore from that. Other than that, she feels ok. No pain in back currently. 2/10 back pain at worst for the past 7 days.  Back was not nearly as bad after last session compared to the initial evaluation. Back and R LE is not as stiff, posture is better with the home exercises.  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 No heavy lifting. 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 5 hours per day.    Pertinent History  Pt reports a history of chronic low back pain, which began with her last pregnancy >2YA, and then when her son was closer to 7-10m old, she began to notice a concomitant RLE  paresthesia from hip down to foot,  sometimes with total foot numbness. She also noted increased stiffness in her low back. At one point had significant weakness and ease in fatigue with BLE, heaviness in legs, which limited activity. Pt also has difficulty with prolonged sitting such as in driving. She is free of sciatica some days, but has aggravation with more physical exersion related to work and play of motherhood. Pt reports her symptoms are consistnetly related to her period, and she has much less symptoms the first week after her period. Symptoms are almost exclusively controlled through actvity/postural modification.    Limitations  Sitting    How long can you sit comfortably?  Limited to less than 60 minutes in the car.    How long can you stand comfortably?  10 minutes    How long can you walk comfortably?  not limited, frequently hikes/walks with her son    Diagnostic tests  xrays: pt reports nothing remarkable.    Patient Stated Goals  resolve pain and be able to play with her son without pain and limitations           Pt was only able to attend 1 follow-up visit since her initial evaluation. 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  Her working hours usually end about 3 pm Monday to Thursdays and will try to schedule her follow up appointments after those times to try to attend more treatment sessions. Currently working on her home exercises which she feels help decrease tightness and improve her posture. Added a trunk strengthening exercise today to help with core strength and minimize back and LE symptoms. Progress to goals are similar currently secondary to pt being only able to attend 1 follow up session. Good tolerance with core strengthening exercises for her back. Pt will benefit from continued skilled physical therapy services to decrease pain, improve strength, function and ability to lift her son as well as tolerate driving longer distances more  comfortably for her back and her R LE.                       PT Education - 05/25/19 1622    Education Details  HEP    Person(s) Educated  Patient    Methods  Explanation;Demonstration;Verbal cues;Handout    Comprehension  Verbalized understanding;Returned demonstration       PT Short Term Goals - 05/25/19 1444      PT SHORT TERM GOAL #1   Title  After 4 weeks patient will report inproved management of her back pain and RLE symptoms through use of HEP.    Baseline  minimal symptoms management; decreased stiffness with home exercises (05/25/2019)    Time  4    Period  Weeks    Status  On-going    Target Date  05/27/19      PT SHORT TERM GOAL #2   Title  After 4 weeks patient will report improved management of back pain in car and tolerance to driving up to 60 minutes without RLE symptoms.    Baseline  symptoms at 64minutes; symptoms at 45 minutes (05/25/2019)    Time  4    Period  Weeks    Status  On-going    Target Date  05/27/19        PT Long Term Goals - 05/25/19 1446      PT LONG TERM GOAL #1   Title  After 8 weeks patient will report improved ability to pick up son without limitations from back pain.    Baseline  pain with lots of lifting son; pain with picking up her son multiple times (05/25/2019)    Time  8    Period  Weeks    Status  On-going    Target Date  07/22/19      PT LONG TERM GOAL #2   Title  After 8 weeks pt will demonstrate good form as instructed for squat and deadlift to improve ability to progress core strength required to perform parenting duties    Baseline  B knees anterior to toes and B genu valgus with squat and deadlift form (05/25/2019)    Time  8    Period  Weeks    Status  On-going    Target Date  07/22/19      PT LONG TERM GOAL #3   Title  After 8 weeks patient will report back pain less than 2 days per week in order to better participate in IADL and household actiivty.    Baseline  disabling pain on busy days;  potentially back pain every day if she is busy every day; recently started a job (05/25/2019)    Time  8    Period  Weeks  Status  On-going    Target Date  07/22/19            Plan - 05/25/19 1623    Clinical Impression Statement  Pt was only able to attend 1 follow-up visit since her initial evaluation. 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 Her working hours usually end about 3 pm Monday to Thursdays and will try to schedule her follow up appointments after those times to try to attend more treatment sessions. Currently working on her home exercises which she feels help decrease tightness and improve her posture. Added a trunk strengthening exercise today to help with core strength and minimize back and LE symptoms. Progress to goals are similar currently secondary to pt being only able to attend 1 follow up session. Good tolerance with core strengthening exercises for her back. Pt will benefit from continued skilled physical therapy services to decrease pain, improve strength, function and ability to lift her son as well as tolerate driving longer distances more comfortably for her back and her R LE.    Personal Factors and Comorbidities  Age;Fitness;Past/Current Experience;Social Background;Time since onset of injury/illness/exacerbation;Behavior Pattern    Examination-Activity Limitations  Squat;Caring for Others;Carry;Sit    Examination-Participation Restrictions  Cleaning;Shop;Meal Prep;Interpersonal Relationship    Stability/Clinical Decision Making  Stable/Uncomplicated    Rehab Potential  Fair    PT Frequency  2x / week    PT Duration  8 weeks    PT Treatment/Interventions  ADLs/Self Care Home Management;Traction;Moist Heat;Electrical Stimulation;Functional mobility training;Therapeutic exercise;Dry needling;Passive range of motion;Manual techniques;Patient/family education    PT Next Visit Plan  Assess symptoms and HEP  affect; assess hip ROM bilat, trial sciatic flossing for Right leg; Assess squat and deadlift form in context of patient lifting her child.    PT Home Exercise Plan  Prone pressup, repeated extension in standing with belt overpressure    Consulted and Agree with Plan of Care  Patient       Patient will benefit from skilled therapeutic intervention in order to improve the following deficits and impairments:  Decreased activity tolerance, Hypomobility, Improper body mechanics, Impaired tone, Decreased strength, Postural dysfunction, Hypermobility  Visit Diagnosis: 1. Chronic midline low back pain with right-sided sciatica   2. Other muscle spasm        Problem List Patient Active Problem List   Diagnosis Date Noted  . Increased nuchal translucency space on fetal ultrasound 12/26/2016  . [redacted] weeks gestation of pregnancy     Joneen Boers PT, DPT   05/25/2019, 4:30 PM  [facility] [address], [place], [address] Phone: [phone]   Fax:  [phone]  Name: Tajai Suder MRN: [id number] Date of Birth: 02-17-1991

## 2019-05-25 NOTE — Patient Instructions (Signed)
Seated bilateral shoulder extension isometrics    Sitting on a chair   Gently press your hands onto your thighs.   Hold for 5 seconds.   Repeat 10 times.   Perform 3 sets daily.

## 2019-05-27 ENCOUNTER — Ambulatory Visit: Payer: Medicaid Other

## 2019-06-01 ENCOUNTER — Ambulatory Visit: Payer: Medicaid Other

## 2019-06-01 ENCOUNTER — Other Ambulatory Visit: Payer: Self-pay

## 2019-06-01 DIAGNOSIS — M62838 Other muscle spasm: Secondary | ICD-10-CM

## 2019-06-01 DIAGNOSIS — G8929 Other chronic pain: Secondary | ICD-10-CM | POA: Diagnosis present

## 2019-06-01 DIAGNOSIS — M5441 Lumbago with sciatica, right side: Secondary | ICD-10-CM | POA: Diagnosis not present

## 2019-06-01 NOTE — Therapy (Signed)
[facility] [address], [place], [address] Phone: [phone]   Fax:  [phone]  Physical Therapy Treatment  Patient Details  Name: Deborah Hansen MRN: [id number] Date of Birth: 03/02/1991 Referring Provider (PT): Eda Paschal, NP   Encounter Date: 06/01/2019  PT End of Session - 06/01/19 3664    Visit Number  3    Number of Visits  18    Date for PT Re-Evaluation  07/22/19    Authorization Type  [place] Medicaid    Authorization Time Period  8 visits from 05/31/2019 to 07/11/2019    Authorization - Visit Number  1    Authorization - Number of Visits  8    PT Start Time  4034    PT Stop Time  1530    PT Time Calculation (min)  52 min    Equipment Utilized During Treatment  Gait belt    Activity Tolerance  Patient tolerated treatment well;No increased pain    Behavior During Therapy  WFL for tasks assessed/performed       Past Medical History:  Diagnosis Date  . Anxiety   . Asthma   . Depression     Past Surgical History:  Procedure Laterality Date  . WISDOM TOOTH EXTRACTION      There were no vitals filed for this visit.  Subjective Assessment - 06/01/19 1439    Subjective  Back is doing well. Has been also doing curl ups 3x/day.    Pertinent History  Pt reports a history of chronic low back pain, which began with her last pregnancy >2YA, and then when her son was closer to 7-20m old, she began to notice a concomitant RLE  paresthesia from hip down to foot, sometimes with total foot numbness. She also noted increased stiffness in her low back. At one point had significant weakness and ease in fatigue with BLE, heaviness in legs, which limited activity. Pt also has difficulty with prolonged sitting such as in driving. She is free of sciatica some days, but has aggravation with more physical exersion related to work and play of motherhood. Pt reports her symptoms are consistnetly related to her  period, and she has much less symptoms the first week after her period. Symptoms are almost exclusively controlled through actvity/postural modification.    Limitations  Sitting    How long can you sit comfortably?  Limited to less than 60 minutes in the car.    How long can you stand comfortably?  10 minutes    How long can you walk comfortably?  not limited, frequently hikes/walks with her son    Diagnostic tests  xrays: pt reports nothing remarkable.    Patient Stated Goals  resolve pain and be able to play with her son without pain and limitations    Currently in Pain?  No/denies                               PT Education - 06/01/19 1509    Education Details  ther-ex, HEP    Person(s) Educated  Patient    Methods  Explanation;Demonstration;Tactile cues;Verbal cues;Handout    Comprehension  Returned demonstration;Verbalized understanding      Objectives  Aggravating factors: picking up her son, carrying her son at her L hip, driving, sitting for long periods of time, walking around the grocery store for about 1 hour. Static standing for 10 minutes.  4/5 R glute max strength   Medbridge Access Code: [email or web address]    Standing posture: Slight R lumbar rotation, R paraspinal muscle tension lumbar  R lateral shift.   R LE paresthesia comes and goes per pt.  Prolonged walking and or sitting increases R LE symptoms.     Therapeutic exercise  Planks 15 seconds, 10 seconds, 13 seconds to promote core strengthening.    Prone glute max set 10x5 seconds each side  Then on physioball 10x5 seconds each LE   S/L hip abduction 10x2 each side to promote glute med strengthening and promote lumbopelvic stability with prolonged walking such as hiking.   Pt states feeling pelvic drop when hiking.   Supine hip fallouts 10x each LE. Cues for level pelvis  Good lumbopelvic control until fatigue.   Physioball forward roll outs to promote core activation 5x5  seconds  Wrist discomfort  Standing back extension 10x5 seconds   Sitting on physioball:  Bilateral scapular retraction green band 10x5 seconds   Then 10x10 seconds    Cues for preventing shoulder shrug and backward lean compensation.    Try SLS with physioball toss, emphasis on level pelvis next visit if appropriate Try prone back extension next visit if appropriate    Improved exercise technique, movement at target joints, use of target muscles after mod verbal, visual, tactile cues.    Response to treatment Good muscle use felt during exercises. Pt tolerated session well without aggravation of symptoms.     Clinical impression Worked on glute max, med and abdominal muscle strengthening to promote lumbopelvic stability and control when performing prolonged tasks such as walking or standing. Good muscle use felt with exercises. Pt will benefit from continued skilled physical therapy services to decrease pain, improve strength, function, and ability to tolerate prolonged activities or tasks.     PT Short Term Goals - 05/25/19 1444      PT SHORT TERM GOAL #1   Title  After 4 weeks patient will report inproved management of her back pain and RLE symptoms through use of HEP.    Baseline  minimal symptoms management; decreased stiffness with home exercises (05/25/2019)    Time  4    Period  Weeks    Status  On-going    Target Date  05/27/19      PT SHORT TERM GOAL #2   Title  After 4 weeks patient will report improved management of back pain in car and tolerance to driving up to 60 minutes without RLE symptoms.    Baseline  symptoms at 5minutes; symptoms at 45 minutes (05/25/2019)    Time  4    Period  Weeks    Status  On-going    Target Date  05/27/19        PT Long Term Goals - 05/25/19 1446      PT LONG TERM GOAL #1   Title  After 8 weeks patient will report improved ability to pick up son without limitations from back pain.    Baseline  pain with lots of  lifting son; pain with picking up her son multiple times (05/25/2019)    Time  8    Period  Weeks    Status  On-going    Target Date  07/22/19      PT LONG TERM GOAL #2   Title  After 8 weeks pt will demonstrate good form as instructed for squat and deadlift to improve ability to progress core strength required to perform parenting  duties    Baseline  B knees anterior to toes and B genu valgus with squat and deadlift form (05/25/2019)    Time  8    Period  Weeks    Status  On-going    Target Date  07/22/19      PT LONG TERM GOAL #3   Title  After 8 weeks patient will report back pain less than 2 days per week in order to better participate in IADL and household actiivty.    Baseline  disabling pain on busy days; potentially back pain every day if she is busy every day; recently started a job (05/25/2019)    Time  8    Period  Weeks    Status  On-going    Target Date  07/22/19            Plan - 06/01/19 1503    Clinical Impression Statement  Worked on glute max, med and abdominal muscle strengthening to promote lumbopelvic stability and control when performing prolonged tasks such as walking or standing. Good muscle use felt with exercises. Pt will benefit from continued skilled physical therapy services to decrease pain, improve strength, function, and ability to tolerate prolonged activities or tasks.    Personal Factors and Comorbidities  Age;Fitness;Past/Current Experience;Social Background;Time since onset of injury/illness/exacerbation;Behavior Pattern    Examination-Activity Limitations  Squat;Caring for Others;Carry;Sit    Examination-Participation Restrictions  Cleaning;Shop;Meal Prep;Interpersonal Relationship    Stability/Clinical Decision Making  Stable/Uncomplicated    Rehab Potential  Fair    PT Frequency  2x / week    PT Duration  8 weeks    PT Treatment/Interventions  ADLs/Self Care Home Management;Traction;Moist Heat;Electrical Stimulation;Functional mobility  training;Therapeutic exercise;Dry needling;Passive range of motion;Manual techniques;Patient/family education    PT Next Visit Plan  Assess symptoms and HEP affect; assess hip ROM bilat, trial sciatic flossing for Right leg; Assess squat and deadlift form in context of patient lifting her child.    PT Home Exercise Plan  Prone pressup, repeated extension in standing with belt overpressure    Consulted and Agree with Plan of Care  Patient       Patient will benefit from skilled therapeutic intervention in order to improve the following deficits and impairments:  Decreased activity tolerance, Hypomobility, Improper body mechanics, Impaired tone, Decreased strength, Postural dysfunction, Hypermobility  Visit Diagnosis: 1. Chronic midline low back pain with right-sided sciatica   2. Other muscle spasm        Problem List Patient Active Problem List   Diagnosis Date Noted  . Increased nuchal translucency space on fetal ultrasound 12/26/2016  . [redacted] weeks gestation of pregnancy    Joneen Boers PT, DPT   06/01/2019, 3:47 PM  [facility] [address] [place], [address] Phone: [phone]   Fax:  [phone]  Name: Deborah Hansen MRN: [id number] Date of Birth: 1990-12-07

## 2019-06-03 ENCOUNTER — Ambulatory Visit: Payer: Medicaid Other

## 2019-06-08 ENCOUNTER — Other Ambulatory Visit: Payer: Self-pay

## 2019-06-08 ENCOUNTER — Ambulatory Visit: Payer: Medicaid Other

## 2019-06-08 DIAGNOSIS — M62838 Other muscle spasm: Secondary | ICD-10-CM

## 2019-06-08 DIAGNOSIS — M5441 Lumbago with sciatica, right side: Secondary | ICD-10-CM | POA: Diagnosis not present

## 2019-06-08 DIAGNOSIS — G8929 Other chronic pain: Secondary | ICD-10-CM

## 2019-06-08 NOTE — Patient Instructions (Signed)
Access Code: [email or web address]  URL: [email or web address]  Date: 06/08/2019  Prepared by: Lieutenant Diego   Exercises Bent Knee Fallouts - 10 reps - 3 sets - 3x daily - 7x weekly Prone Hip Extension with Bent Knee - 10 reps - 2-3 sets - 5 seconds hold - 1x daily - 7x weekly Sidelying Hip Abduction - 10 reps - 2-3 sets - 1x daily - 7x weekly Scapular Retraction with Resistance - 10 reps - 3 sets - 10 seconds hold - 1x daily - 7x weekly Standing Lumbar Extension - 10 reps - 1 sets - 1x daily - 7x weekly

## 2019-06-08 NOTE — Therapy (Signed)
[facility] [address], [place], [address] Phone: [phone]   Fax:  [phone]  Physical Therapy Treatment  Patient Details  Name: Deborah Hansen MRN: [id number] Date of Birth: [birth year] Referring Provider (PT): Eda Paschal, NP   Encounter Date: 06/08/2019  PT End of Session - 06/08/19 1455    Visit Number  4    Number of Visits  18    Date for PT Re-Evaluation  07/22/19    Authorization Type  [place] Medicaid    Authorization Time Period  8 visits from 05/31/2019 to 07/11/2019    Authorization - Visit Number  2    Authorization - Number of Visits  8    PT Start Time  1430    PT Stop Time  1520    PT Time Calculation (min)  50 min    Equipment Utilized During Treatment  Gait belt    Activity Tolerance  Patient tolerated treatment well;No increased pain    Behavior During Therapy  WFL for tasks assessed/performed       Past Medical History:  Diagnosis Date  . Anxiety   . Asthma   . Depression     Past Surgical History:  Procedure Laterality Date  . WISDOM TOOTH EXTRACTION      There were no vitals filed for this visit.  Subjective Assessment - 06/08/19 1431    Subjective  Patient reported that she was fine after her last PT session. Stated she has had some flare ups on the R, and down into the R leg. Also stated she recently got her period, and her back pain decreased. However today has been experiencing some twisting/charlie horse in her back. Able to relieve some pain with laying on a flat firm surface.    Pertinent History  Pt reports a history of chronic low back pain, which began with her last pregnancy >2YA, and then when her son was closer to 7-48m old, she began to notice a concomitant RLE  paresthesia from hip down to foot, sometimes with total foot numbness. She also noted increased stiffness in her low back. At one point had significant weakness and ease in fatigue with BLE,  heaviness in legs, which limited activity. Pt also has difficulty with prolonged sitting such as in driving. She is free of sciatica some days, but has aggravation with more physical exersion related to work and play of motherhood. Pt reports her symptoms are consistnetly related to her period, and she has much less symptoms the first week after her period. Symptoms are almost exclusively controlled through actvity/postural modification.    Limitations  Sitting    How long can you sit comfortably?  Limited to less than 60 minutes in the car.    How long can you stand comfortably?  10 minutes    How long can you walk comfortably?  not limited, frequently hikes/walks with her son    Diagnostic tests  xrays: pt reports nothing remarkable.    Patient Stated Goals  resolve pain and be able to play with her son without pain and limitations       Objectives   Aggravating factors: picking up her son, carrying her son at her L hip, driving, sitting for long periods of time, walking around the grocery store for about 1 hour. Static standing for 10 minutes.    4/5 R glute max strength     Med[employer] Access Code: [email or web address] (HEP updated, forgot to print and  give it to her)    Standing posture: Slight R lumbar rotation, R paraspinal muscle tension lumbar  R lateral shift.    R LE paresthesia comes and goes per pt.  Prolonged walking and or sitting increases R LE symptoms.     Therapeutic exercise   Planks (elbow planks) 15secs x3    Prone glute max set knee bent on physioball 2x10x5 seconds each LE, tactile and verbal cues for form    S/L hip abduction 10x2 each side to promote glute med strengthening and promote lumbopelvic stability with prolonged walking such as hiking.              Pt states feeling pelvic drop when hiking.       Physioball forward roll to pike position, piking without rolling back in onto wrists to promote core activation 2x10              Sitting on physioball:              Bilateral scapular retraction green band 10x5 seconds                         Then 10x10 seconds               Cues for preventing shoulder shrug and backward lean compensation.    seated thoracic extension with PT block of lumbar spine x10  Standing shoulder extension 2x10 with GTB tactile cues    SLS with physioball toss 2kg x20 (much more difficulty noted in R SLS to maintain pelvic alignment)    Patient response/clinical impression: Patient responded well to progression of program today. Tactile cues needed for proper scapular retraction to avoid shoulder hiking, good carryover with repetitions.The patient exhibited decreased thoracic extension with exercises as appropriate, educated about proper spinal mobility, pt verbalized understanding and eager to continue with therapy.     PT Education - 06/08/19 1433    Education Details  ther-ex, HEP    Person(s) Educated  Patient    Methods  Explanation;Demonstration;Tactile cues;Verbal cues    Comprehension  Verbalized understanding;Returned demonstration       PT Short Term Goals - 05/25/19 1444      PT SHORT TERM GOAL #1   Title  After 4 weeks patient will report inproved management of her back pain and RLE symptoms through use of HEP.    Baseline  minimal symptoms management; decreased stiffness with home exercises (05/25/2019)    Time  4    Period  Weeks    Status  On-going    Target Date  05/27/19      PT SHORT TERM GOAL #2   Title  After 4 weeks patient will report improved management of back pain in car and tolerance to driving up to 60 minutes without RLE symptoms.    Baseline  symptoms at 47minutes; symptoms at 45 minutes (05/25/2019)    Time  4    Period  Weeks    Status  On-going    Target Date  05/27/19        PT Long Term Goals - 05/25/19 1446      PT LONG TERM GOAL #1   Title  After 8 weeks patient will report improved ability to pick up son without limitations from back pain.    Baseline  pain with  lots of lifting son; pain with picking up her son multiple times (05/25/2019)    Time  8  Period  Weeks    Status  On-going    Target Date  07/22/19      PT LONG TERM GOAL #2   Title  After 8 weeks pt will demonstrate good form as instructed for squat and deadlift to improve ability to progress core strength required to perform parenting duties    Baseline  B knees anterior to toes and B genu valgus with squat and deadlift form (05/25/2019)    Time  8    Period  Weeks    Status  On-going    Target Date  07/22/19      PT LONG TERM GOAL #3   Title  After 8 weeks patient will report back pain less than 2 days per week in order to better participate in IADL and household actiivty.    Baseline  disabling pain on busy days; potentially back pain every day if she is busy every day; recently started a job (05/25/2019)    Time  8    Period  Weeks    Status  On-going    Target Date  07/22/19            Plan - 06/08/19 1450    Clinical Impression Statement  Patient responded well to progression of program today. Tactile cues needed for proper scapular retraction to avoid shoulder hiking, good carryover with repetitions.The patient exhibited decreased thoracic extension with exercises as appropriate, educated about proper spinal mobility, pt verbalized understanding and eager to continue with therapy.    Personal Factors and Comorbidities  Age;Fitness;Past/Current Experience;Social Background;Time since onset of injury/illness/exacerbation;Behavior Pattern    Examination-Activity Limitations  Squat;Caring for Others;Carry;Sit    Examination-Participation Restrictions  Cleaning;Shop;Meal Prep;Interpersonal Relationship    Stability/Clinical Decision Making  Stable/Uncomplicated    Rehab Potential  Fair    PT Frequency  2x / week    PT Duration  8 weeks    PT Treatment/Interventions  ADLs/Self Care Home Management;Traction;Moist Heat;Electrical Stimulation;Functional mobility  training;Therapeutic exercise;Dry needling;Passive range of motion;Manual techniques;Patient/family education    PT Next Visit Plan  Assess symptoms and HEP affect; assess hip ROM bilat, trial sciatic flossing for Right leg; Assess squat and deadlift form in context of patient lifting her child.    PT Home Exercise Plan  Prone pressup, repeated extension in standing with belt overpressure    Consulted and Agree with Plan of Care  Patient       Patient will benefit from skilled therapeutic intervention in order to improve the following deficits and impairments:  Decreased activity tolerance, Hypomobility, Improper body mechanics, Impaired tone, Decreased strength, Postural dysfunction, Hypermobility  Visit Diagnosis: Chronic midline low back pain with right-sided sciatica  Other muscle spasm     Problem List Patient Active Problem List   Diagnosis Date Noted  . Increased nuchal translucency space on fetal ultrasound 12/26/2016  . [redacted] weeks gestation of pregnancy     Lieutenant Diego PT, DPT 3:22 PM,06/08/19 [facility] [address], [place], [address] Phone: [phone]   Fax:  [phone]  Name: Deborah Hansen MRN: [id number] Date of Birth: June 10, 1991

## 2019-06-15 ENCOUNTER — Ambulatory Visit: Payer: Medicaid Other | Attending: Adult Health

## 2019-06-29 ENCOUNTER — Ambulatory Visit: Payer: Medicaid Other

## 2019-07-06 ENCOUNTER — Ambulatory Visit: Payer: Medicaid Other

## 2019-07-07 DIAGNOSIS — M62838 Other muscle spasm: Secondary | ICD-10-CM

## 2019-07-07 DIAGNOSIS — M5441 Lumbago with sciatica, right side: Secondary | ICD-10-CM

## 2019-07-07 DIAGNOSIS — G8929 Other chronic pain: Secondary | ICD-10-CM

## 2019-07-07 NOTE — Therapy (Signed)
[facility] [address] [place], [address] Phone: [phone]   Fax:  [phone]  Physical Therapy Note  Patient Details  Name: Deborah Hansen MRN: [id number] Date of Birth: 1991/09/22 Referring Provider (PT): Eda Paschal, NP   Encounter Date: 07/07/2019    Past Medical History:  Diagnosis Date  . Anxiety   . Asthma   . Depression     Past Surgical History:  Procedure Laterality Date  . WISDOM TOOTH EXTRACTION      There were no vitals filed for this visit.                             Per phone call with patient, she was unable to attend some of her physical therapy sessions secondary to undergoing medical detox to come off of one of her medications. Pt states that she feels like the home exercises help with her back pain and symptoms when she performs them. Her driving tolerance improved to 1-1.5 hours, being able to drive about 3 hours total to [place] with rest breaks. Still has difficulty picking her 8 year old son multiple times a day, feeling back pain at the end of the night.  Frequency of back pain ranges from every day per week when she has her period to 3-4 days a week. Pt however has had weeks when she only had back pain 1-2 days per week.  Pt has made some progress with physical therapy towards goals and function and works on her home exercises when able (unable to perform her exercises during the medical detox period).  Worked on posture, glute med, glute max, and trunk strengthening to promote proper positioning of low back and pelvis as well as lumbopelvic control to help decrease stress to low back and decrease pain and symptoms. Pt will benefit from continued skilled physical therapy services to improve strength, ability to take care of her 40 year old son, ability to lift, drive, and perform standing tasks more comfortably.      PT Short Term Goals -  07/07/19 1919      PT SHORT TERM GOAL #1   Title  After 4 weeks patient will report inproved management of her back pain and RLE symptoms through use of HEP.    Baseline  minimal symptoms management; decreased stiffness with home exercises (05/25/2019); pt states that the HEP helps with her pain (07/07/2019)    Time  4    Period  Weeks    Status  On-going    Target Date  05/27/19      PT SHORT TERM GOAL #2   Title  After 4 weeks patient will report improved management of back pain in car and tolerance to driving up to 60 minutes without RLE symptoms.    Baseline  symptoms at 83minutes; symptoms at 45 minutes (05/25/2019); able to tolerate driving 1 to 1.5 hours (07/07/2019)    Time  4    Period  Weeks    Status  Achieved    Target Date  05/27/19        PT Long Term Goals - 07/07/19 1921      PT LONG TERM GOAL #1   Title  After 8 weeks patient will report improved ability to pick up son without limitations from back pain.    Baseline  pain with lots of lifting son; pain with picking up her son multiple times (  05/25/2019); Still has difficulty picking up her son multiple times a day. Feels pain at the end of the night (07/07/2019)    Time  8    Period  Weeks    Status  On-going    Target Date  07/22/19      PT LONG TERM GOAL #2   Title  After 8 weeks pt will demonstrate good form as instructed for squat and deadlift to improve ability to progress core strength required to perform parenting duties    Baseline  B knees anterior to toes and B genu valgus with squat and deadlift form (05/25/2019); unable to assess secondary to unable to attend recent sessions from medical detox (07/07/2019)    Time  8    Period  Weeks    Status  Unable to assess    Target Date  07/22/19      PT LONG TERM GOAL #3   Title  After 8 weeks patient will report back pain less than 2 days per week in order to better participate in IADL and household actiivty.    Baseline  disabling pain on busy days; potentially  back pain every day if she is busy every day; recently started a job (05/25/2019); Back pain every day when she has her period, usually has pain 3-4 days a week but has had weeks when she only had 1-2 days back pain per week (07/07/2019)    Time  8    Period  Weeks    Status  On-going    Target Date  07/22/19              Patient will benefit from skilled therapeutic intervention in order to improve the following deficits and impairments:     Visit Diagnosis: Chronic midline low back pain with right-sided sciatica  Other muscle spasm     Problem List Patient Active Problem List   Diagnosis Date Noted  . Increased nuchal translucency space on fetal ultrasound 12/26/2016  . [redacted] weeks gestation of pregnancy     Joneen Boers PT, DPT   07/07/2019, 7:25 PM  [facility] [address] [place], [address] Phone: [phone]   Fax:  [phone]  Name: Deborah Hansen MRN: [id number] Date of Birth: 09-25-1991

## 2019-07-20 ENCOUNTER — Other Ambulatory Visit: Payer: Self-pay

## 2019-07-20 ENCOUNTER — Ambulatory Visit: Payer: Medicaid Other | Attending: Adult Health

## 2019-07-20 DIAGNOSIS — M62838 Other muscle spasm: Secondary | ICD-10-CM | POA: Insufficient documentation

## 2019-07-20 DIAGNOSIS — G8929 Other chronic pain: Secondary | ICD-10-CM | POA: Diagnosis present

## 2019-07-20 DIAGNOSIS — M5441 Lumbago with sciatica, right side: Secondary | ICD-10-CM

## 2019-07-20 NOTE — Patient Instructions (Signed)
Access Code: [email or web address]  URL: [email or web address]  Date: 07/20/2019  Prepared by: Joneen Boers   Exercises Bent Knee Fallouts - 10 reps - 3 sets - 3x daily - 7x weekly Prone Hip Extension with Bent Knee - 10 reps - 2-3 sets - 5 seconds hold - 1x daily - 7x weekly Sidelying Hip Abduction - 10 reps - 2-3 sets - 1x daily - 7x weekly Scapular Retraction with Resistance - 10 reps - 3 sets - 10 seconds hold - 1x daily - 7x weekly Standing Lumbar Extension - 10 reps - 1 sets - 1x daily - 7x weekly Quadruped Rockback Posterior Hip Capsule Stretch - 5 reps - 1 sets - 15 seconds hold - 5x daily - 7x weekly

## 2019-07-20 NOTE — Therapy (Signed)
[facility] [address] [place], [address] Phone: [phone]   Fax:  [phone]  Physical Therapy Treatment  Patient Details  Name: Deborah Hansen MRN: [id number] Date of Birth: 17-Feb-1991 Referring Provider (PT): Eda Paschal, NP   Encounter Date: 07/20/2019  PT End of Session - 07/20/19 1454    Visit Number  5    Number of Visits  16    Date for PT Re-Evaluation  09/02/19    Authorization Type  [place] [employer]    Authorization Time Period  12 visits from 07/14/2019 to 08/24/2019    Authorization - Visit Number  5    Authorization - Number of Visits  16    PT Start Time  [id number]    PT Stop Time  1545    PT Time Calculation (min)  49 min    Equipment Utilized During Treatment  Gait belt    Activity Tolerance  Patient tolerated treatment well;No increased pain    Behavior During Therapy  WFL for tasks assessed/performed       Past Medical History:  Diagnosis Date  . Anxiety   . Asthma   . Depression     Past Surgical History:  Procedure Laterality Date  . WISDOM TOOTH EXTRACTION      There were no vitals filed for this visit.  Subjective Assessment - 07/20/19 1457    Subjective  Today her back feels great. Just got over her period 3-4 days ago. R LE symptoms start about 1.5 weeks before the period (07/03/2019). Her period started 07/12/2019 and ended 07/16/2019. Last time she had R LE symptoms was around 07/13/2019.  Her primary care provider Eda Paschal NP knows about her period and back pain. Discussed birth control with her. Currently sleeps on a firm mattress since about a week ago.    Pertinent History  Pt reports a history of chronic low back pain, which began with her last pregnancy >2YA, and then when her son was closer to 7-33m old, she began to notice a concomitant RLE  paresthesia from hip down to foot, sometimes with total foot numbness. She also noted increased stiffness in her low  back. At one point had significant weakness and ease in fatigue with BLE, heaviness in legs, which limited activity. Pt also has difficulty with prolonged sitting such as in driving. She is free of sciatica some days, but has aggravation with more physical exersion related to work and play of motherhood. Pt reports her symptoms are consistnetly related to her period, and she has much less symptoms the first week after her period. Symptoms are almost exclusively controlled through actvity/postural modification.    Limitations  Sitting    How long can you sit comfortably?  Limited to less than 60 minutes in the car.    How long can you stand comfortably?  10 minutes    How long can you walk comfortably?  not limited, frequently hikes/walks with her son    Diagnostic tests  xrays: pt reports nothing remarkable.    Patient Stated Goals  resolve pain and be able to play with her son without pain and limitations    Currently in Pain?  No/denies                               PT Education - 07/20/19 1506    Education Details  ther-ex, HEP    Person(s)  Educated  Patient    Methods  Explanation;Demonstration;Tactile cues;Verbal cues;Handout    Comprehension  Returned demonstration;Verbalized understanding      Objectives  Aggravating factors: picking up her son, carrying her son at her L hip, driving, sitting for long periods of time, walking around the grocery store for about 1 hour. Static standing for 10 minutes.   4/5 R glute max strength   MedbridgeAccess Code: [email or web address]    Standing posture: Slight R lumbar rotation, R paraspinal muscle tension lumbar  R lateral shift.   R LE paresthesia comes and goes per pt.  Prolonged walking and or sitting increases R LE symptoms.      Joint play:  Good lumbar UPA mobility, discomfort around R L4 TP and R L3 TP.  Therapeutic exercise  Trunk AROM  Flexion: full with reproduction of R sciatic  discomfort  Extension: WFL with low back pressure. No R LE symptoms  R side bend: WFL with R low back ache  L side bend: WFL, no discomfort    R rotation: full  L rotation: full    SLUMP:   R LE (+) with reproduction of symptoms. Change of symptoms with ankle movements and low back extension   L LE (+) with change of symptoms with ankle movements  (-) R piriformis test R LE  Prone press-ups 10x5 seconds for 2 sets  Hip IR at 90/90 position  R: 24 degrees    L: 24 degrees   Self posterior capsule stretch R 15 seconds x 5  ergonomic lifting 4x.   Cues for proper knee to foot position  Pt education to not bend and lift  Improved exercise technique, movement at target joints, use of target muscles after min to mod verbal, visual, tactile cues.      Response to treatment Fair tolerance to today's session.    Clinical impression Pt currently reports doing well today pertaining to her back and R LE symptoms in which finishing her monthly cycle may play a factor. Performed trunk AROM which reproduced her R LE symptoms with flexion. Also demonstrates extension preference with centralization of symptoms to her low back. Pt educated in minimizing trunk flexion related positions and activities, use of proper ergonomics to protect her back when lifting, especially with lifting her toddler, and to continue performing her HEP to promote trunk, and hip strength. Pt overall improving ability to tolerate driving in her car, as well as with her symptom management with her HEP. Pt will benefit from continued skilled physical therapy services to decrease pain, improve strength, function, and ability to take care of her toddler more comfortably.       PT Short Term Goals - 07/20/19 1819      PT SHORT TERM GOAL #1   Title  After 4 weeks patient will report inproved management of her back pain and RLE symptoms through use of HEP.    Baseline  minimal symptoms management; decreased  stiffness with home exercises (05/25/2019); pt states that the HEP helps with her pain (07/07/2019)    Time  3    Period  Weeks    Status  On-going    Target Date  08/12/19      PT SHORT TERM GOAL #2   Title  After 4 weeks patient will report improved management of back pain in car and tolerance to driving up to 60 minutes without RLE symptoms.    Baseline  symptoms at 81minutes; symptoms at 45 minutes (05/25/2019); able to tolerate  driving 1 to 1.5 hours (07/07/2019)    Time  4    Period  Weeks    Status  Achieved    Target Date  05/27/19        PT Long Term Goals - 07/20/19 1819      PT LONG TERM GOAL #1   Title  After 8 weeks patient will report improved ability to pick up son without limitations from back pain.    Baseline  pain with lots of lifting son; pain with picking up her son multiple times (05/25/2019); Still has difficulty picking up her son multiple times a day. Feels pain at the end of the night (07/07/2019)    Time  6    Period  Weeks    Status  On-going    Target Date  09/02/19      PT LONG TERM GOAL #2   Title  After 8 weeks pt will demonstrate good form as instructed for squat and deadlift to improve ability to progress core strength required to perform parenting duties    Baseline  B knees anterior to toes and B genu valgus with squat and deadlift form (05/25/2019); unable to assess secondary to unable to attend recent sessions from medical detox (07/07/2019); B knees anterior to toes (07/20/2019)    Time  6    Period  Weeks    Status  On-going    Target Date  09/02/19      PT LONG TERM GOAL #3   Title  After 8 weeks patient will report back pain less than 2 days per week in order to better participate in IADL and household actiivty.    Baseline  disabling pain on busy days; potentially back pain every day if she is busy every day; recently started a job (05/25/2019); Back pain every day when she has her period, usually has pain 3-4 days a week but has had weeks when  she only had 1-2 days back pain per week (07/07/2019)    Time  6    Period  Weeks    Status  On-going    Target Date  09/02/19            Plan - 07/20/19 1817    Clinical Impression Statement  Pt currently reports doing well today pertaining to her back and R LE symptoms in which finishing her monthly cycle may play a factor. Performed trunk AROM which reproduced her R LE symptoms with flexion. Also demonstrates extension preference with centralization of symptoms to her low back. Pt educated in minimizing trunk flexion related positions and activities, use of proper ergonomics to protect her back when lifting, especially with lifting her toddler, and to continue performing her HEP to promote trunk, and hip strength. Pt overall improving ability to tolerate driving in her car, as well as with her symptom management with her HEP. Pt will benefit from continued skilled physical therapy services to decrease pain, improve strength, function, and ability to take care of her toddler more comfortably.    Personal Factors and Comorbidities  Age;Fitness;Past/Current Experience;Social Background;Time since onset of injury/illness/exacerbation;Behavior Pattern    Examination-Activity Limitations  Squat;Caring for Others;Carry;Sit    Examination-Participation Restrictions  Cleaning;Shop;Meal Prep;Interpersonal Relationship    Stability/Clinical Decision Making  Stable/Uncomplicated    Clinical Decision Making  Low    Rehab Potential  Fair    PT Frequency  2x / week    PT Duration  6 weeks    PT Treatment/Interventions  ADLs/Self Care  Home Management;Traction;Moist Heat;Electrical Stimulation;Functional mobility training;Therapeutic exercise;Dry needling;Passive range of motion;Manual techniques;Patient/family education    PT Next Visit Plan  Assess symptoms and HEP affect; assess hip ROM bilat, trial sciatic flossing for Right leg; Assess squat and deadlift form in context of patient lifting her child.     PT Home Exercise Plan  Prone pressup, repeated extension in standing with belt overpressure    Consulted and Agree with Plan of Care  Patient       Patient will benefit from skilled therapeutic intervention in order to improve the following deficits and impairments:  Decreased activity tolerance, Hypomobility, Improper body mechanics, Impaired tone, Decreased strength, Postural dysfunction, Hypermobility  Visit Diagnosis: Chronic midline low back pain with right-sided sciatica - Plan: PT plan of care cert/re-cert  Other muscle spasm - Plan: PT plan of care cert/re-cert     Problem List Patient Active Problem List   Diagnosis Date Noted  . Increased nuchal translucency space on fetal ultrasound 12/26/2016  . [redacted] weeks gestation of pregnancy      Joneen Boers PT, DPT   07/20/2019, 6:31 PM  [facility] [address], [place], [address] Phone: [phone]   Fax:  [phone]  Name: Deborah Hansen MRN: [id number] Date of Birth: Sep 15, 1991

## 2019-07-27 ENCOUNTER — Ambulatory Visit: Payer: Medicaid Other

## 2019-08-03 ENCOUNTER — Ambulatory Visit: Payer: Medicaid Other

## 2019-08-10 ENCOUNTER — Other Ambulatory Visit: Payer: Self-pay

## 2019-08-10 ENCOUNTER — Ambulatory Visit: Payer: Medicaid Other

## 2019-08-10 DIAGNOSIS — G8929 Other chronic pain: Secondary | ICD-10-CM

## 2019-08-10 DIAGNOSIS — M5441 Lumbago with sciatica, right side: Secondary | ICD-10-CM

## 2019-08-10 DIAGNOSIS — M62838 Other muscle spasm: Secondary | ICD-10-CM

## 2019-08-10 NOTE — Therapy (Signed)
[facility] [address], [place], [address] Phone: [phone]   Fax:  [phone]  Physical Therapy Treatment  Patient Details  Name: Deborah Hansen MRN: [id number] Date of Birth: 04-26-1991 Referring Provider (PT): Eda Paschal, NP   Encounter Date: 08/10/2019  PT End of Session - 08/10/19 1434    Visit Number  6    Number of Visits  16    Date for PT Re-Evaluation  09/02/19    Authorization Type  [place] [employer]    Authorization Time Period  12 visits from 07/14/2019 to 08/24/2019    Authorization - Visit Number  6    Authorization - Number of Visits  16    PT Start Time  [id number]    PT Stop Time  1518    PT Time Calculation (min)  44 min    Equipment Utilized During Treatment  Gait belt    Activity Tolerance  Patient tolerated treatment well;No increased pain    Behavior During Therapy  WFL for tasks assessed/performed       Past Medical History:  Diagnosis Date  . Anxiety   . Asthma   . Depression     Past Surgical History:  Procedure Laterality Date  . WISDOM TOOTH EXTRACTION      There were no vitals filed for this visit.  Subjective Assessment - 08/10/19 1435    Subjective  Back is doing good. Her period starts tomorrow and currently feels great. Back pain is not as bad as it was before her period. Quarantined herself for the past 2 weeks due to her son's dad having COVID. Pt was in a lot of pain due to taking care of her son a lot. No symptoms wiht COVID. Was tested 3 weeks ago and was negative. Pt got tested after her son's dad got sick. Pt never had symptoms.    Pertinent History  Pt reports a history of chronic low back pain, which began with her last pregnancy >2YA, and then when her son was closer to 7-28m old, she began to notice a concomitant RLE  paresthesia from hip down to foot, sometimes with total foot numbness. She also noted increased stiffness in her low back. At one point had  significant weakness and ease in fatigue with BLE, heaviness in legs, which limited activity. Pt also has difficulty with prolonged sitting such as in driving. She is free of sciatica some days, but has aggravation with more physical exersion related to work and play of motherhood. Pt reports her symptoms are consistnetly related to her period, and she has much less symptoms the first week after her period. Symptoms are almost exclusively controlled through actvity/postural modification.    Limitations  Sitting    How long can you sit comfortably?  Limited to less than 60 minutes in the car.    How long can you stand comfortably?  10 minutes    How long can you walk comfortably?  not limited, frequently hikes/walks with her son    Diagnostic tests  xrays: pt reports nothing remarkable.    Patient Stated Goals  resolve pain and be able to play with her son without pain and limitations    Currently in Pain?  No/denies                               PT Education - 08/10/19 1443    Education Details  ther-ex    Person(s) Educated  Patient    Methods  Explanation;Demonstration;Tactile cues;Verbal cues    Comprehension  Returned demonstration;Verbalized understanding        Objectives  Aggravating factors: picking up her son, carrying her son at her L hip, driving, sitting for long periods of time, walking around the grocery store for about 1 hour. Static standing for 10 minutes.   4/5 R glute max strength   MedbridgeAccess Code: [email or web address]    Standing posture: Slight R lumbar rotation, R paraspinal muscle tension lumbar  R lateral shift.   R LE paresthesia comes and goes per pt.  Prolonged walking and or sitting increases R LE symptoms.     Joint play:             Good lumbar UPA mobility, discomfort around R L4 TP and R L3 TP.  Therapeutic exercise  Prone press up 10x5 seconds   Prone glute max extension   R 10x5 seconds  L 10x5  seconds   Supine bridge with march 10x, then 2x. Low back pressure from hyper extending.  Decreases with rest  Single leg bridge 1/2 way to promote glute strengthening  R 5x3  L 5x3  Supine with posterior pelvic tilt  Single leg slides 5x3 each LE  Give as part of her HEP next visit if appropriate  [place]man  10x5 seconds for 2 sets    Side planks regular  R 5x5 seconds   L 5x5 seconds   Prone B shoulder scaption 10x5 seconds  Prone bilatearl shoulder abduction 10x5 seconds for 2 sets    Improved exercise technique, movement at target joints, use of target muscles after mod verbal, visual, tactile cues.      Response to treatment Pt tolerated session well without aggravation  of symptoms.     Clinical impression No complain of pain after session. Good trunk muscle use felt. Scapular strengthening felt good per pt. Continued working on trunk, scapular, and glute strengthening to promote lumbopelvic control with activities. Improving back pain based on subjective reports of her period about to come but her back is currently doing well. Pt will benefit from continued skilled physical therapy services to decrease pain, improve strength, and function.     PT Short Term Goals - 07/20/19 1819      PT SHORT TERM GOAL #1   Title  After 4 weeks patient will report inproved management of her back pain and RLE symptoms through use of HEP.    Baseline  minimal symptoms management; decreased stiffness with home exercises (05/25/2019); pt states that the HEP helps with her pain (07/07/2019)    Time  3    Period  Weeks    Status  On-going    Target Date  08/12/19      PT SHORT TERM GOAL #2   Title  After 4 weeks patient will report improved management of back pain in car and tolerance to driving up to 60 minutes without RLE symptoms.    Baseline  symptoms at 32minutes; symptoms at 45 minutes (05/25/2019); able to tolerate driving 1 to 1.5 hours (07/07/2019)    Time  4    Period   Weeks    Status  Achieved    Target Date  05/27/19        PT Long Term Goals - 07/20/19 1819      PT LONG TERM GOAL #1   Title  After 8 weeks patient will report improved ability to pick  up son without limitations from back pain.    Baseline  pain with lots of lifting son; pain with picking up her son multiple times (05/25/2019); Still has difficulty picking up her son multiple times a day. Feels pain at the end of the night (07/07/2019)    Time  6    Period  Weeks    Status  On-going    Target Date  09/02/19      PT LONG TERM GOAL #2   Title  After 8 weeks pt will demonstrate good form as instructed for squat and deadlift to improve ability to progress core strength required to perform parenting duties    Baseline  B knees anterior to toes and B genu valgus with squat and deadlift form (05/25/2019); unable to assess secondary to unable to attend recent sessions from medical detox (07/07/2019); B knees anterior to toes (07/20/2019)    Time  6    Period  Weeks    Status  On-going    Target Date  09/02/19      PT LONG TERM GOAL #3   Title  After 8 weeks patient will report back pain less than 2 days per week in order to better participate in IADL and household actiivty.    Baseline  disabling pain on busy days; potentially back pain every day if she is busy every day; recently started a job (05/25/2019); Back pain every day when she has her period, usually has pain 3-4 days a week but has had weeks when she only had 1-2 days back pain per week (07/07/2019)    Time  6    Period  Weeks    Status  On-going    Target Date  09/02/19            Plan - 08/10/19 1443    Clinical Impression Statement  No complain of pain after session. Good trunk muscle use felt. Scapular strengthening felt good per pt. Continued working on trunk, scapular, and glute strengthening to promote lumbopelvic control with activities. Improving back pain based on subjective reports of her period about to come but her  back is currently doing well. Pt will benefit from continued skilled physical therapy services to decrease pain, improve strength, and function.    Personal Factors and Comorbidities  Age;Fitness;Past/Current Experience;Social Background;Time since onset of injury/illness/exacerbation;Behavior Pattern    Examination-Activity Limitations  Squat;Caring for Others;Carry;Sit    Examination-Participation Restrictions  Cleaning;Shop;Meal Prep;Interpersonal Relationship    Stability/Clinical Decision Making  Stable/Uncomplicated    Rehab Potential  Fair    PT Frequency  2x / week    PT Duration  6 weeks    PT Treatment/Interventions  ADLs/Self Care Home Management;Traction;Moist Heat;Electrical Stimulation;Functional mobility training;Therapeutic exercise;Dry needling;Passive range of motion;Manual techniques;Patient/family education    PT Next Visit Plan  Assess symptoms and HEP affect; assess hip ROM bilat, trial sciatic flossing for Right leg; Assess squat and deadlift form in context of patient lifting her child.    PT Home Exercise Plan  Prone pressup, repeated extension in standing with belt overpressure    Consulted and Agree with Plan of Care  Patient       Patient will benefit from skilled therapeutic intervention in order to improve the following deficits and impairments:  Decreased activity tolerance, Hypomobility, Improper body mechanics, Impaired tone, Decreased strength, Postural dysfunction, Hypermobility  Visit Diagnosis: Chronic midline low back pain with right-sided sciatica  Other muscle spasm     Problem List Patient Active Problem List  Diagnosis Date Noted  . Increased nuchal translucency space on fetal ultrasound 12/26/2016  . [redacted] weeks gestation of pregnancy     Joneen Boers PT, DPT   08/10/2019, 4:37 PM  [facility] [address] [place], [address] Phone: [phone]   Fax:   [phone]  Name: Deborah Hansen MRN: [id number] Date of Birth: 09-26-1991

## 2019-08-10 NOTE — Patient Instructions (Signed)
Access Code: [email or web address]  URL: [email or web address]  Date: 08/10/2019  Prepared by: Joneen Boers   Exercises Bent Knee Fallouts - 10 reps - 3 sets - 3x daily - 7x weekly Prone Hip Extension with Bent Knee - 10 reps - 2-3 sets - 5 seconds hold - 1x daily - 7x weekly Sidelying Hip Abduction - 10 reps - 2-3 sets - 1x daily - 7x weekly                  Scapular Retraction with Resistance - 10 reps - 3 sets - 10 seconds hold - 1x daily - 7x weekly Standing Lumbar Extension - 10 reps - 1 sets - 1x daily - 7x weekly Quadruped Rockback Posterior Hip Capsule Stretch - 5 reps - 1 sets - 15 seconds hold - 5x daily - 7x weekly Prone Single Arm Shoulder Y - 10 reps - 3 sets - 5 seconds hold - 1x daily - 7x weekly

## 2019-08-17 ENCOUNTER — Ambulatory Visit: Payer: Medicaid Other

## 2019-08-19 ENCOUNTER — Ambulatory Visit: Payer: Medicaid Other | Attending: Adult Health

## 2019-08-19 ENCOUNTER — Other Ambulatory Visit: Payer: Self-pay

## 2019-08-19 DIAGNOSIS — G8929 Other chronic pain: Secondary | ICD-10-CM | POA: Diagnosis present

## 2019-08-19 DIAGNOSIS — M62838 Other muscle spasm: Secondary | ICD-10-CM | POA: Diagnosis present

## 2019-08-19 DIAGNOSIS — M5441 Lumbago with sciatica, right side: Secondary | ICD-10-CM | POA: Diagnosis present

## 2019-08-19 NOTE — Therapy (Signed)
[facility] [address] [place], [address] Phone: [phone]   Fax:  [phone]  Physical Therapy Treatment  Patient Details  Name: Deborah Hansen MRN: [id number] Date of Birth: 1990/10/15 Referring Provider (PT): Eda Paschal, NP   Encounter Date: 08/19/2019  PT End of Session - 08/19/19 1036    Visit Number  7    Number of Visits  16    Date for PT Re-Evaluation  09/02/19    Authorization Type  [place] Medicaid    Authorization Time Period  12 visits from 07/14/2019 to 08/24/2019    Authorization - Visit Number  7    Authorization - Number of Visits  16    PT Start Time  [id number]    PT Stop Time  1117    PT Time Calculation (min)  41 min    Equipment Utilized During Treatment  Gait belt    Activity Tolerance  Patient tolerated treatment well;No increased pain    Behavior During Therapy  WFL for tasks assessed/performed       Past Medical History:  Diagnosis Date  . Anxiety   . Asthma   . Depression     Past Surgical History:  Procedure Laterality Date  . WISDOM TOOTH EXTRACTION      There were no vitals filed for this visit.  Subjective Assessment - 08/19/19 1037    Subjective  Back is ok. Did really well during her period. Did not get pain like she normally would. No back pain or R LE pain currently.  Has not noticed B leg fatigue recently. 2/10 low back pain at most, R LE discomfort 2/10 at most for the past 7 days.    Pertinent History  Pt reports a history of chronic low back pain, which began with her last pregnancy >2YA, and then when her son was closer to 7-65m old, she began to notice a concomitant RLE  paresthesia from hip down to foot, sometimes with total foot numbness. She also noted increased stiffness in her low back. At one point had significant weakness and ease in fatigue with BLE, heaviness in legs, which limited activity. Pt also has difficulty with prolonged sitting such as in  driving. She is free of sciatica some days, but has aggravation with more physical exersion related to work and play of motherhood. Pt reports her symptoms are consistnetly related to her period, and she has much less symptoms the first week after her period. Symptoms are almost exclusively controlled through actvity/postural modification.    Limitations  Sitting    How long can you sit comfortably?  Limited to less than 60 minutes in the car.    How long can you stand comfortably?  10 minutes    How long can you walk comfortably?  not limited, frequently hikes/walks with her son    Diagnostic tests  xrays: pt reports nothing remarkable.    Patient Stated Goals  resolve pain and be able to play with her son without pain and limitations    Currently in Pain?  No/denies                               PT Education - 08/19/19 1100    Education Details  Ther-ex    Person(s) Educated  Patient    Methods  Explanation;Demonstration;Tactile cues;Verbal cues    Comprehension  Returned demonstration;Verbalized understanding  Objectives  Aggravating factors: picking up her son, carrying her son at her L hip, driving, sitting for long periods of time, walking around the grocery store for about 1 hour. Static standing for 10 minutes.   4/5 R glute max strength   MedbridgeAccess Code: [email or web address]    Standing posture: Slight R lumbar rotation, R paraspinal muscle tension lumbar  R lateral shift.   R LE paresthesia comes and goes per pt.  Prolonged walking and or sitting increases R LE symptoms.     Joint play: Good lumbar UPA mobility, discomfort around R L4 TP and R L3 TP.   Pt son 30 lbs   Therapeutic exercise  Reviewed standing back extension   Ergonomic lift 20 lbs kettle bell 10x. Good form after cues  Then 40 lbs 5x   Then lifting 40 lbs and placing it on the floor to the L then to the R 4x, then 1x. LE fatigue  afterwards  Improved ergonomics  Prone glute max extension              R 10x5 seconds             L 10x5 seconds   Supermans             10x5 seconds   Single leg bridge 1/2 way to promote glute strengthening             R 5x3             L 5x3  Supine with posterior pelvic tilt             Single leg slides 5x3 each LE           Prone B shoulder scaption 10x5 seconds     Improved exercise technique, movement at target joints, use of target muscles after min to mod verbal, visual, tactile cues.      Response to treatment Pt tolerated session well without aggravation  of symptoms.    Clinical impression Pt making very good progress with decreasing back and R LE pain based on subjective reports. Pt also demonstrates improved ergonomics with lifting up to 40 lbs (son is 30 lbs). Continued working on improving hip and trunk strength to promote progress. Pt tolerated session well without aggravation of symptoms. Pt will benefit from continued skilled physical therapy services to decrease pain, improve strength and function.      PT Short Term Goals - 07/20/19 1819      PT SHORT TERM GOAL #1   Title  After 4 weeks patient will report inproved management of her back pain and RLE symptoms through use of HEP.    Baseline  minimal symptoms management; decreased stiffness with home exercises (05/25/2019); pt states that the HEP helps with her pain (07/07/2019)    Time  3    Period  Weeks    Status  On-going    Target Date  08/12/19      PT SHORT TERM GOAL #2   Title  After 4 weeks patient will report improved management of back pain in car and tolerance to driving up to 60 minutes without RLE symptoms.    Baseline  symptoms at 60minutes; symptoms at 45 minutes (05/25/2019); able to tolerate driving 1 to 1.5 hours (07/07/2019)    Time  4    Period  Weeks    Status  Achieved    Target Date  05/27/19        PT Long Term Goals -  07/20/19 1819      PT LONG TERM  GOAL #1   Title  After 8 weeks patient will report improved ability to pick up son without limitations from back pain.    Baseline  pain with lots of lifting son; pain with picking up her son multiple times (05/25/2019); Still has difficulty picking up her son multiple times a day. Feels pain at the end of the night (07/07/2019)    Time  6    Period  Weeks    Status  On-going    Target Date  09/02/19      PT LONG TERM GOAL #2   Title  After 8 weeks pt will demonstrate good form as instructed for squat and deadlift to improve ability to progress core strength required to perform parenting duties    Baseline  B knees anterior to toes and B genu valgus with squat and deadlift form (05/25/2019); unable to assess secondary to unable to attend recent sessions from medical detox (07/07/2019); B knees anterior to toes (07/20/2019)    Time  6    Period  Weeks    Status  On-going    Target Date  09/02/19      PT LONG TERM GOAL #3   Title  After 8 weeks patient will report back pain less than 2 days per week in order to better participate in IADL and household actiivty.    Baseline  disabling pain on busy days; potentially back pain every day if she is busy every day; recently started a job (05/25/2019); Back pain every day when she has her period, usually has pain 3-4 days a week but has had weeks when she only had 1-2 days back pain per week (07/07/2019)    Time  6    Period  Weeks    Status  On-going    Target Date  09/02/19            Plan - 08/19/19 1101    Clinical Impression Statement  Pt making very good progress with decreasing back and R LE pain based on subjective reports. Pt also demonstrates improved ergonomics with lifting up to 40 lbs (son is 30 lbs). Continued working on improving hip and trunk strength to promote progress. Pt tolerated session well without aggravation of symptoms. Pt will benefit from continued skilled physical therapy services to decrease pain, improve strength and  function.    Personal Factors and Comorbidities  Age;Fitness;Past/Current Experience;Social Background;Time since onset of injury/illness/exacerbation;Behavior Pattern    Examination-Activity Limitations  Squat;Caring for Others;Carry;Sit    Examination-Participation Restrictions  Cleaning;Shop;Meal Prep;Interpersonal Relationship    Stability/Clinical Decision Making  Stable/Uncomplicated    Rehab Potential  Fair    PT Frequency  2x / week    PT Duration  6 weeks    PT Treatment/Interventions  ADLs/Self Care Home Management;Traction;Moist Heat;Electrical Stimulation;Functional mobility training;Therapeutic exercise;Dry needling;Passive range of motion;Manual techniques;Patient/family education    PT Next Visit Plan  Assess symptoms and HEP affect; assess hip ROM bilat, trial sciatic flossing for Right leg; Assess squat and deadlift form in context of patient lifting her child.    PT Home Exercise Plan  Prone pressup, repeated extension in standing with belt overpressure    Consulted and Agree with Plan of Care  Patient       Patient will benefit from skilled therapeutic intervention in order to improve the following deficits and impairments:  Decreased activity tolerance, Hypomobility, Improper body mechanics, Impaired tone, Decreased strength, Postural dysfunction,  Hypermobility  Visit Diagnosis: Chronic midline low back pain with right-sided sciatica  Other muscle spasm     Problem List Patient Active Problem List   Diagnosis Date Noted  . Increased nuchal translucency space on fetal ultrasound 12/26/2016  . [redacted] weeks gestation of pregnancy     Joneen Boers PT, DPT   08/19/2019, 3:04 PM  [facility] [address] [place], [address] Phone: [phone]   Fax:  [phone]  Name: Deborah Hansen MRN: [id number] Date of Birth: [birth year]

## 2019-08-24 ENCOUNTER — Ambulatory Visit: Payer: Medicaid Other

## 2019-08-24 ENCOUNTER — Other Ambulatory Visit: Payer: Self-pay

## 2019-08-24 DIAGNOSIS — M62838 Other muscle spasm: Secondary | ICD-10-CM

## 2019-08-24 DIAGNOSIS — G8929 Other chronic pain: Secondary | ICD-10-CM

## 2019-08-24 DIAGNOSIS — M5441 Lumbago with sciatica, right side: Secondary | ICD-10-CM | POA: Diagnosis not present

## 2019-08-24 NOTE — Therapy (Signed)
[facility] [address] [place], [address] Phone: [phone]   Fax:  [phone]  Physical Therapy Treatment  Patient Details  Name: Deborah Hansen MRN: [id number] Date of Birth: 1990/12/04 Referring Provider (PT): Eda Paschal, NP   Encounter Date: 08/24/2019  PT End of Session - 08/24/19 1521    Visit Number  8    Number of Visits  28    Date for PT Re-Evaluation  10/07/19    Authorization Type  [place] Medicaid    Authorization Time Period  12 visits from 07/14/2019 to 08/24/2019    Authorization - Visit Number  8    Authorization - Number of Visits  16    PT Start Time  4193    PT Stop Time  1603    PT Time Calculation (min)  42 min    Equipment Utilized During Treatment  --    Activity Tolerance  Patient tolerated treatment well;No increased pain    Behavior During Therapy  WFL for tasks assessed/performed       Past Medical History:  Diagnosis Date  . Anxiety   . Asthma   . Depression     Past Surgical History:  Procedure Laterality Date  . WISDOM TOOTH EXTRACTION      There were no vitals filed for this visit.  Subjective Assessment - 08/24/19 1522    Subjective  No pain or discomfort today. Had a little bit of discomfort yesterday at lower back. After PT, pt states feeling more back pain and LE sore which might be due to the kettle bell exercise. Soreness lasted from Thursday to Saturday. Felt the irritation throughout the next couple of day. Had some leg fatigue and some pressure on the lower back yesterday. Today has been pretty good but has not been on her feet a ton today. Was on her feet a lot yesterday.  Pt does not pick up her son as much as she used to. Pt conscious about how she lifts her son, focusing on lifting him the right way. Lifting her son does not feel as bad for her back. Pt states she wants to continue PT.    Pertinent History  Pt reports a history of chronic low  back pain, which began with her last pregnancy >2YA, and then when her son was closer to 7-66mold, she began to notice a concomitant RLE  paresthesia from hip down to foot, sometimes with total foot numbness. She also noted increased stiffness in her low back. At one point had significant weakness and ease in fatigue with BLE, heaviness in legs, which limited activity. Pt also has difficulty with prolonged sitting such as in driving. She is free of sciatica some days, but has aggravation with more physical exersion related to work and play of motherhood. Pt reports her symptoms are consistnetly related to her period, and she has much less symptoms the first week after her period. Symptoms are almost exclusively controlled through actvity/postural modification.    Limitations  Sitting    How long can you sit comfortably?  Limited to less than 60 minutes in the car.    How long can you stand comfortably?  10 minutes    How long can you walk comfortably?  not limited, frequently hikes/walks with her son    Diagnostic tests  xrays: pt reports nothing remarkable.    Patient Stated Goals  resolve pain and be able to play with her son without  pain and limitations    Currently in Pain?  No/denies                               PT Education - 08/24/19 1537    Education Details  ther-ex, HEP    Person(s) Educated  Patient    Methods  Explanation;Demonstration;Tactile cues;Verbal cues;Handout    Comprehension  Returned demonstration;Verbalized understanding      Objectives  Aggravating factors: picking up her son, carrying her son at her L hip, driving, sitting for long periods of time, walking around the grocery store for about 1 hour. Static standing for 10 minutes.   4/5 R glute max strength  6/10 back pain at worst prior to PT, more of a discomfort prior to starting PT.     MedbridgeAccess Code: [email or web address]    Standing posture: Slight R lumbar rotation, R  paraspinal muscle tension lumbar  R lateral shift.   R LE paresthesia comes and goes per pt.  Prolonged walking and or sitting increases R LE symptoms.     Joint play: Good lumbar UPA mobility, discomfort around R L4 TP and R L3 TP.   Pt son 30 lbs   Therapeutic exercise  Reviewed plan of care: continue PT 2x/week for 6 weeks to continue progress   Supine with posterior pelvic tilt Single leg slides 10x2 each LE   Standing pallof press double green band  R 10x5 seconds for 2 sets  L 10x5 seconds for 2 sets  Running man  R 10x2  L 10x2    Single leg bridge 1/2 wayto promote glute strengthening R 10x L 10x      Improved exercise technique, movement at target joints, use of target muscles after min to mod verbal, visual, tactile cues.     Response to treatment Pt tolerated session well without aggravation of symptoms.    Clinical impression Pt making very good progress with decreased low back pain, ergonomics and ability to lift her son based on subjective and clinical presentation. Pt also better able to manage her back pain and R LE symptoms with her HEP. Pt making progress with PT toward goals. Pt will benefit from continued skilled physical therapy services to continue to decrease pain, and improve ability to perform tasks at home such as chores and taking care of her son more comfortably.          PT Short Term Goals - 08/24/19 1904      PT SHORT TERM GOAL #1   Title  After 4 weeks patient will report inproved management of her back pain and RLE symptoms through use of HEP.    Baseline  minimal symptoms management; decreased stiffness with home exercises (05/25/2019); pt states that the HEP helps with her pain (07/07/2019), (08/24/2019)    Time  3    Period  Weeks    Status  Achieved    Target Date  08/12/19      PT SHORT TERM GOAL #2   Title  After 4 weeks  patient will report improved management of back pain in car and tolerance to driving up to 60 minutes without RLE symptoms.    Baseline  symptoms at 28mnutes; symptoms at 45 minutes (05/25/2019); able to tolerate driving 1 to 1.5 hours (07/07/2019)    Time  4    Period  Weeks    Status  Achieved    Target Date  05/27/19  PT Long Term Goals - 08/24/19 1545      PT LONG TERM GOAL #1   Title  After 8 weeks patient will report improved ability to pick up son without limitations from back pain.    Baseline  pain with lots of lifting son; pain with picking up her son multiple times (05/25/2019); Still has difficulty picking up her son multiple times a day. Feels pain at the end of the night (07/07/2019); less discomfort when picking up her son properly (08/24/2019)    Time  6    Period  Weeks    Status  Achieved    Target Date  09/02/19      PT LONG TERM GOAL #2   Title  After 8 weeks pt will demonstrate good form as instructed for squat and deadlift to improve ability to progress core strength required to perform parenting duties    Baseline  B knees anterior to toes and B genu valgus with squat and deadlift form (05/25/2019); unable to assess secondary to unable to attend recent sessions from medical detox (07/07/2019); B knees anterior to toes (07/20/2019); good ergonomics with squat and deadlift (08/19/2019)    Time  6    Period  Weeks    Status  Achieved    Target Date  09/02/19      PT LONG TERM GOAL #3   Title  After 8 weeks patient will report back pain less than 2 days per week in order to better participate in IADL and household actiivty.    Baseline  disabling pain on busy days; potentially back pain every day if she is busy every day; recently started a job (05/25/2019); Back pain every day when she has her period, usually has pain 3-4 days a week but has had weeks when she only had 1-2 days back pain per week (07/07/2019); 2-3 days a week pt had back pain but not the whole day, just  flare ups during the day (08/24/2019)    Time  6    Period  Weeks    Status  Partially Met    Target Date  10/07/19      PT LONG TERM GOAL #4   Title  Pt will have a decrease in back pain to 2/10 or less at worst consistently to promote ability to perform functional tasks at home more comfortably such as taking care of her son.    Baseline  5-6/10 back pain at most (before starting PT); 2-3/10 back pain at most for the past 7 days (08/24/19)    Time  6    Period  Weeks    Status  New    Target Date  10/07/19            Plan - 08/24/19 1544    Clinical Impression Statement  Pt making very good progress with decreased low back pain, ergonomics and ability to lift her son based on subjective and clinical presentation. Pt also better able to manage her back pain and R LE symptoms with her HEP. Pt making progress with PT toward goals. Pt will benefit from continued skilled physical therapy services to continue to decrease pain, and improve ability to perform tasks at home such as chores and taking care of her son more comfortably.    Personal Factors and Comorbidities  Age;Fitness;Past/Current Experience;Social Background;Time since onset of injury/illness/exacerbation;Behavior Pattern    Examination-Activity Limitations  Squat;Caring for Others;Carry;Sit    Examination-Participation Restrictions  Cleaning;Shop;Meal Prep;Interpersonal Relationship  Stability/Clinical Decision Making  Stable/Uncomplicated    Clinical Decision Making  Low    Rehab Potential  Fair    PT Frequency  2x / week    PT Duration  6 weeks    PT Treatment/Interventions  ADLs/Self Care Home Management;Traction;Moist Heat;Electrical Stimulation;Functional mobility training;Therapeutic exercise;Dry needling;Passive range of motion;Manual techniques;Patient/family education    PT Next Visit Plan  Assess symptoms and HEP affect; assess hip ROM bilat, trial sciatic flossing for Right leg; Assess squat and deadlift form in  context of patient lifting her child.    PT Home Exercise Plan  Medbridge Access Code: [email or web address]    Consulted and Agree with Plan of Care  Patient       Patient will benefit from skilled therapeutic intervention in order to improve the following deficits and impairments:  Decreased activity tolerance, Hypomobility, Improper body mechanics, Impaired tone, Decreased strength, Postural dysfunction, Hypermobility  Visit Diagnosis: Chronic midline low back pain with right-sided sciatica - Plan: PT plan of care cert/re-cert  Other muscle spasm - Plan: PT plan of care cert/re-cert     Problem List Patient Active Problem List   Diagnosis Date Noted  . Increased nuchal translucency space on fetal ultrasound 12/26/2016  . [redacted] weeks gestation of pregnancy     Joneen Boers PT, DPT    08/24/2019, 7:15 PM  [facility] [address] [place], [address] Phone: [phone]   Fax:  [phone]  Name: Deborah Hansen MRN: [id number] Date of Birth: 10/24/1990

## 2019-08-24 NOTE — Patient Instructions (Addendum)
Access Code: [email or web address]  URL: [email or web address]  Date: 08/24/2019  Prepared by: Joneen Boers   Exercises Bent Knee Fallouts - 10 reps - 3 sets - 3x daily - 7x weekly Prone Hip Extension with Bent Knee - 10 reps - 2-3 sets - 5 seconds hold - 1x daily - 7x weekly Sidelying Hip Abduction - 10 reps - 2-3 sets - 1x daily - 7x weekly                  Scapular Retraction with Resistance - 10 reps - 3 sets - 10 seconds hold - 1x daily - 7x weekly Standing Lumbar Extension - 10 reps - 1 sets - 1x daily - 7x weekly Quadruped Rockback Posterior Hip Capsule Stretch - 5 reps - 1 sets - 15 seconds hold - 5x daily - 7x weekly Prone Single Arm Shoulder Y - 10 reps - 3 sets - 5 seconds hold - 1x daily - 7x weekly Supine Heel Slides - 10 reps - 2-3 sets - 1x daily - 7x weekly Standing Anti-Rotation Press with Anchored Resistance - 10 reps - 3 sets - 5 seconds hold - 1x daily - 7x weekly

## 2019-09-07 ENCOUNTER — Other Ambulatory Visit: Payer: Self-pay

## 2019-09-07 ENCOUNTER — Ambulatory Visit: Payer: Medicaid Other

## 2019-09-07 DIAGNOSIS — G8929 Other chronic pain: Secondary | ICD-10-CM

## 2019-09-07 DIAGNOSIS — M5441 Lumbago with sciatica, right side: Secondary | ICD-10-CM | POA: Diagnosis not present

## 2019-09-07 NOTE — Therapy (Signed)
[facility] [address] [place], [address] Phone: [phone]   Fax:  [phone]  Physical Therapy Treatment  Patient Details  Name: Deborah Hansen MRN: [id number] Date of Birth: 1991/06/16 Referring Provider (PT): Eda Paschal, NP   Encounter Date: 09/07/2019  PT End of Session - 09/07/19 1354    Visit Number  9    Number of Visits  28    Date for PT Re-Evaluation  10/07/19    Authorization Type  [place] Medicaid    Authorization Time Period  12 visits from    Authorization - Visit Number  9    Authorization - Number of Visits  16    PT Start Time  6294   pt arrived late   PT Stop Time  1429    PT Time Calculation (min)  34 min    Activity Tolerance  Patient tolerated treatment well;No increased pain    Behavior During Therapy  WFL for tasks assessed/performed       Past Medical History:  Diagnosis Date  . Anxiety   . Asthma   . Depression     Past Surgical History:  Procedure Laterality Date  . WISDOM TOOTH EXTRACTION      There were no vitals filed for this visit.  Subjective Assessment - 09/07/19 1355    Subjective  Back is good. R LE is good.    Pertinent History  Pt reports a history of chronic low back pain, which began with her last pregnancy >2YA, and then when her son was closer to 7-60mold, she began to notice a concomitant RLE  paresthesia from hip down to foot, sometimes with total foot numbness. She also noted increased stiffness in her low back. At one point had significant weakness and ease in fatigue with BLE, heaviness in legs, which limited activity. Pt also has difficulty with prolonged sitting such as in driving. She is free of sciatica some days, but has aggravation with more physical exersion related to work and play of motherhood. Pt reports her symptoms are consistnetly related to her period, and she has much less symptoms the first week after her period. Symptoms are  almost exclusively controlled through actvity/postural modification.    Limitations  Sitting    How long can you sit comfortably?  Limited to less than 60 minutes in the car.    How long can you stand comfortably?  10 minutes    How long can you walk comfortably?  not limited, frequently hikes/walks with her son    Diagnostic tests  xrays: pt reports nothing remarkable.    Patient Stated Goals  resolve pain and be able to play with her son without pain and limitations    Currently in Pain?  No/denies                               PT Education - 09/07/19 1359    Education Details  ther-ex    Person(s) Educated  Patient    Methods  Explanation;Demonstration;Tactile cues;Verbal cues    Comprehension  Returned demonstration;Verbalized understanding      Objectives  Aggravating factors: picking up her son, carrying her son at her L hip, driving, sitting for long periods of time, walking around the grocery store for about 1 hour. Static standing for 10 minutes.   4/5 R glute max strength  6/10 back pain at worst prior to  PT, more of a discomfort prior to starting PT.     MedbridgeAccess Code: [email or web address]    Standing posture: Slight R lumbar rotation, R paraspinal muscle tension lumbar  R lateral shift.   R LE paresthesia comes and goes per pt.  Prolonged walking and or sitting increases R LE symptoms.      Joint play: Good lumbar UPA mobility, discomfort around R L4 TP and R L3 TP.   Pt son 30 lbs   Therapeutic exercise   Supine with posterior pelvic tilt Single leg slides, feet sliding on table 10x3 each LE   Single leg bridge 1/2 wayto promote glute strengthening R 10x2 L 10x2  Push-ups on upside down bosu 10x2, knees bent   standing scapular retraction blue band 10x5 seconds for 2 sets   Standing chops blue band  R 10x5 seconds  L 10x5 seconds   Standing B  shoulder extension blue band  10x5 seconds  Then standing on dyna disc 10x5 seconds for 2 sets  Sitting with proper posture  Manual trunk perturbation from PT all directions x 1 minute, pt holding PVC rod at 90 degrees shoulder flexion     Then with pt sitting on dyna disc 1 minute all directions to promote trunk strength     Improved exercise technique, movement at target joints, use of target muscles after min to mod verbal, visual, tactile cues.     Response to treatment Pt tolerated session well without aggravation of symptoms.    Clinical impression Pt making very good progress with PT towards decreasing low back and R LE pain based on subjective reports. Continued working on trunk strength and control to promote better posture, and decrease low back irritation with LE movements. Pt tolerated session well without aggravation of symptoms Pt will benefit from continued skilled physical therapy services to decrease pain, improve strength and function.    PT Short Term Goals - 08/24/19 1904      PT SHORT TERM GOAL #1   Title  After 4 weeks patient will report inproved management of her back pain and RLE symptoms through use of HEP.    Baseline  minimal symptoms management; decreased stiffness with home exercises (05/25/2019); pt states that the HEP helps with her pain (07/07/2019), (08/24/2019)    Time  3    Period  Weeks    Status  Achieved    Target Date  08/12/19      PT SHORT TERM GOAL #2   Title  After 4 weeks patient will report improved management of back pain in car and tolerance to driving up to 60 minutes without RLE symptoms.    Baseline  symptoms at 26mnutes; symptoms at 45 minutes (05/25/2019); able to tolerate driving 1 to 1.5 hours (07/07/2019)    Time  4    Period  Weeks    Status  Achieved    Target Date  05/27/19        PT Long Term Goals - 08/24/19 1545      PT LONG TERM GOAL #1   Title  After 8 weeks patient will report improved ability to  pick up son without limitations from back pain.    Baseline  pain with lots of lifting son; pain with picking up her son multiple times (05/25/2019); Still has difficulty picking up her son multiple times a day. Feels pain at the end of the night (07/07/2019); less discomfort when picking up her son properly (08/24/2019)    Time  6    Period  Weeks    Status  Achieved    Target Date  09/02/19      PT LONG TERM GOAL #2   Title  After 8 weeks pt will demonstrate good form as instructed for squat and deadlift to improve ability to progress core strength required to perform parenting duties    Baseline  B knees anterior to toes and B genu valgus with squat and deadlift form (05/25/2019); unable to assess secondary to unable to attend recent sessions from medical detox (07/07/2019); B knees anterior to toes (07/20/2019); good ergonomics with squat and deadlift (08/19/2019)    Time  6    Period  Weeks    Status  Achieved    Target Date  09/02/19      PT LONG TERM GOAL #3   Title  After 8 weeks patient will report back pain less than 2 days per week in order to better participate in IADL and household actiivty.    Baseline  disabling pain on busy days; potentially back pain every day if she is busy every day; recently started a job (05/25/2019); Back pain every day when she has her period, usually has pain 3-4 days a week but has had weeks when she only had 1-2 days back pain per week (07/07/2019); 2-3 days a week pt had back pain but not the whole day, just flare ups during the day (08/24/2019)    Time  6    Period  Weeks    Status  Partially Met    Target Date  10/07/19      PT LONG TERM GOAL #4   Title  Pt will have a decrease in back pain to 2/10 or less at worst consistently to promote ability to perform functional tasks at home more comfortably such as taking care of her son.    Baseline  5-6/10 back pain at most (before starting PT); 2-3/10 back pain at most for the past 7 days (08/24/19)    Time   6    Period  Weeks    Status  New    Target Date  10/07/19            Plan - 09/07/19 1400    Clinical Impression Statement  Pt making very good progress with PT towards decreasing low back and R LE pain based on subjective reports. Continued working on trunk strength and control to promote better posture, and decrease low back irritation with LE movements. Pt tolerated session well without aggravation of symptoms Pt will benefit from continued skilled physical therapy services to decrease pain, improve strength and function.    Personal Factors and Comorbidities  Age;Fitness;Past/Current Experience;Social Background;Time since onset of injury/illness/exacerbation;Behavior Pattern    Examination-Activity Limitations  Squat;Caring for Others;Carry;Sit    Examination-Participation Restrictions  Cleaning;Shop;Meal Prep;Interpersonal Relationship    Stability/Clinical Decision Making  Stable/Uncomplicated    Rehab Potential  Fair    PT Frequency  2x / week    PT Duration  6 weeks    PT Treatment/Interventions  ADLs/Self Care Home Management;Traction;Moist Heat;Electrical Stimulation;Functional mobility training;Therapeutic exercise;Dry needling;Passive range of motion;Manual techniques;Patient/family education    PT Next Visit Plan  Assess symptoms and HEP affect; assess hip ROM bilat, trial sciatic flossing for Right leg; Assess squat and deadlift form in context of patient lifting her child.    PT [place] Access Code: [email or web address]    Consulted and Agree with Plan of Care  Patient  Patient will benefit from skilled therapeutic intervention in order to improve the following deficits and impairments:  Decreased activity tolerance, Hypomobility, Improper body mechanics, Impaired tone, Decreased strength, Postural dysfunction, Hypermobility  Visit Diagnosis: Chronic midline low back pain with right-sided sciatica     Problem List Patient Active Problem List    Diagnosis Date Noted  . Increased nuchal translucency space on fetal ultrasound 12/26/2016  . [redacted] weeks gestation of pregnancy     Joneen Boers PT, DPT   09/07/2019, 3:55 PM  [facility] [address] [place], [address] Phone: [phone]   Fax:  [phone]  Name: Deborah Hansen MRN: [id number] Date of Birth: February 10, 1991

## 2019-09-14 ENCOUNTER — Ambulatory Visit: Payer: Medicaid Other

## 2019-09-20 DIAGNOSIS — Z862 Personal history of diseases of the blood and blood-forming organs and certain disorders involving the immune mechanism: Secondary | ICD-10-CM | POA: Insufficient documentation

## 2019-09-21 ENCOUNTER — Telehealth: Payer: Self-pay

## 2019-09-21 ENCOUNTER — Ambulatory Visit: Payer: Medicaid Other | Attending: Adult Health

## 2019-09-21 NOTE — Telephone Encounter (Signed)
No show. Called patient and left a message pertaining to appointment and a reminder for the next follow up session. Return phone call requested. Phone number ([phone]) provided.   

## 2019-09-28 ENCOUNTER — Ambulatory Visit: Payer: Medicaid Other

## 2020-01-06 ENCOUNTER — Ambulatory Visit: Payer: Medicaid Other | Admitting: Dermatology

## 2020-01-06 ENCOUNTER — Other Ambulatory Visit: Payer: Self-pay

## 2020-01-06 DIAGNOSIS — L700 Acne vulgaris: Secondary | ICD-10-CM | POA: Diagnosis not present

## 2020-01-06 DIAGNOSIS — R21 Rash and other nonspecific skin eruption: Secondary | ICD-10-CM | POA: Diagnosis not present

## 2020-01-06 MED ORDER — CLOBETASOL PROPIONATE 0.05 % EX OINT: 1.0000 "application " | TOPICAL_OINTMENT | Freq: Two times a day (BID) | CUTANEOUS | 1 refills | Status: DC

## 2020-01-06 NOTE — Progress Notes (Signed)
   Follow-Up Visit   Subjective  Deborah Hansen is a 29 y.o. female who presents for the following: Acne (using Clindamycin sol. working good, noticed improvement. using CLN wash, Not using doxycycline due to increased yeast infections) and Rash (on left forearm,  x 2 weeks. using OTC HC and lavendar oil.).   The following portions of the chart were reviewed this encounter and updated as appropriate: Tobacco  Allergies  Meds  Problems  Med Hx  Surg Hx  Fam Hx      Review of Systems: No other skin or systemic complaints.  Objective  Well appearing patient in no apparent distress; mood and affect are within normal limits.  A focused examination was performed including head, including the scalp, face, neck, nose, ears, eyelids, and lips. Relevant physical exam findings are noted in the Assessment and Plan.  Objective  Head - Anterior (Face): Back: scattered small inflammatory papules Chest  trace open comedones Face Trace open and closed comedones, rare small inflammatory papules  Objective  Left Forearm - Anterior: Erythematous plaques surrounding tattoo  Assessment & Plan  Acne vulgaris Head - Anterior (Face)  Chronic, not at goal. Continue clindamycin solution, can increase to BID Continue Cln wash Pt d/c'd doxycycline due to yeast infection after starting doxycycline 20 mg bid.  Discussed that typically doxycycline at low dose (20 mg bid) is not high enough to kill off bacteria and increase risk of yeast infections. Recommend retrial with some fluconazole po at home in case she were to get a yeast infection. Patient defers at this time but may consider in the fall.  She did not tolerate spironolactone previously. Discussed option of starting tretinoin to help with comedonal acne but she defers at this time due to photosensitivity risk. May consider in fall.  Hyclodex not covered by her insurance so recommend Puracyn spray (sodium hypochlorite spray) daily to affected  areas.  Rash Left Forearm - Anterior  Reaction to tattoo ink  Will start Clobetasol ointment BID PRN rash Avoid applying to face, groin, and axilla. Use as directed. Risk of skin atrophy with long-term use reviewed.   clobetasol ointment (TEMOVATE) 0.05 % - Left Forearm - Anterior  Return in about 1 month (around 02/06/2020) for Rash.   IDonzetta Kohut, CMA, am acting as scribe for Forest Gleason, MD .  Documentation: I have reviewed the above documentation for accuracy and completeness, and I agree with the above.  Forest Gleason, MD

## 2020-01-06 NOTE — Patient Instructions (Addendum)
Recommend using  OTC Puracyn spray for Back and leave on.

## 2020-01-09 ENCOUNTER — Encounter: Payer: Self-pay | Admitting: Dermatology

## 2020-02-16 ENCOUNTER — Ambulatory Visit: Payer: Medicaid Other | Admitting: Dermatology

## 2020-07-12 ENCOUNTER — Other Ambulatory Visit: Payer: Self-pay

## 2020-07-12 MED ORDER — KETOCONAZOLE 2 % EX SHAM: MEDICATED_SHAMPOO | CUTANEOUS | 2 refills | Status: DC

## 2020-07-12 NOTE — Progress Notes (Signed)
Ketoconazole RF

## 2020-07-17 ENCOUNTER — Other Ambulatory Visit: Payer: Self-pay | Admitting: Student

## 2020-07-17 DIAGNOSIS — R1013 Epigastric pain: Secondary | ICD-10-CM

## 2020-09-11 IMAGING — CR DG THORACIC SPINE 2V
3 series · 3 of 3 positions shown · non-contrast
Comparison: None.

CLINICAL DATA: Back pain

EXAM:
THORACIC SPINE 2 VIEWS

[t-spine ap]
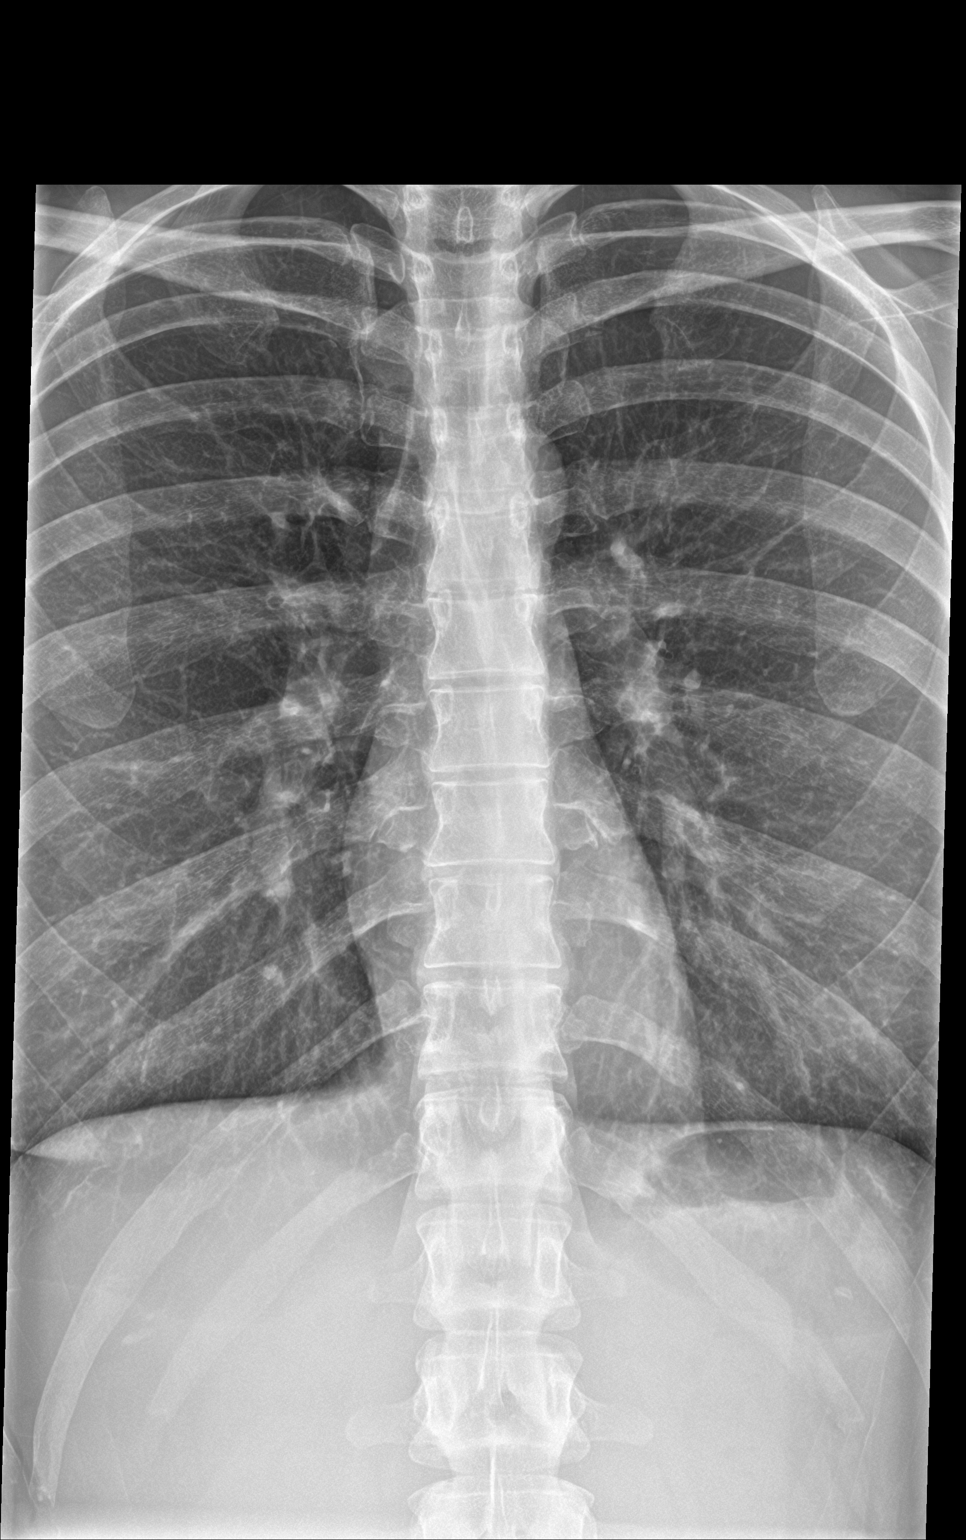

[t-spine lat]
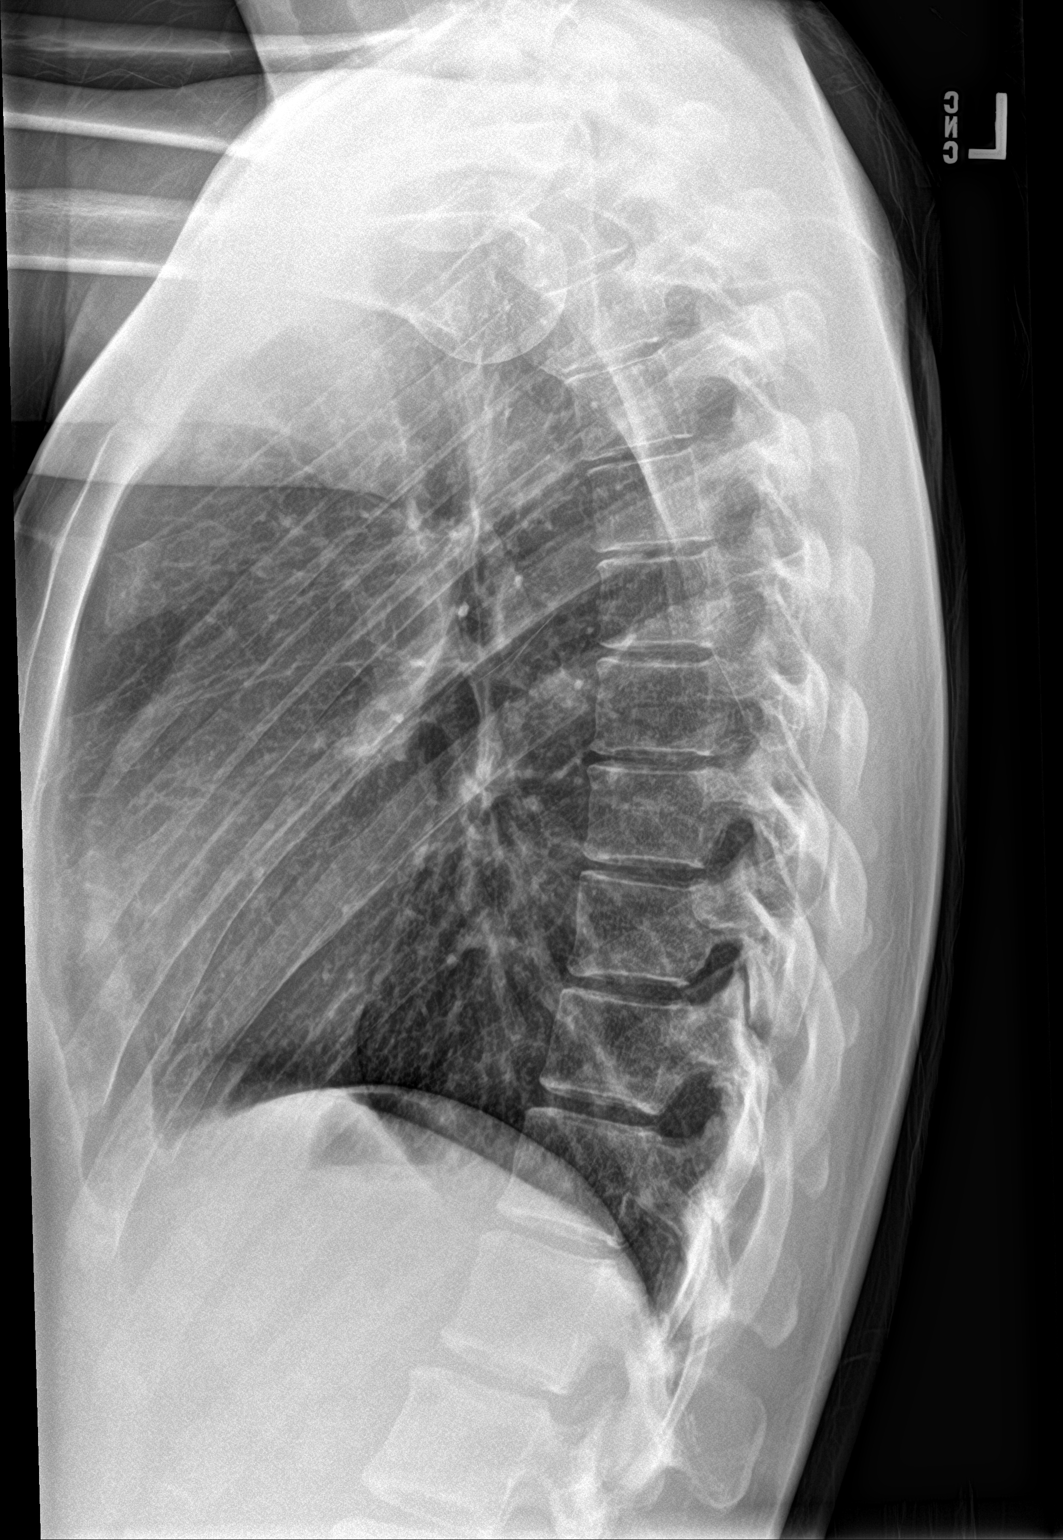

[t-spine swimmers]
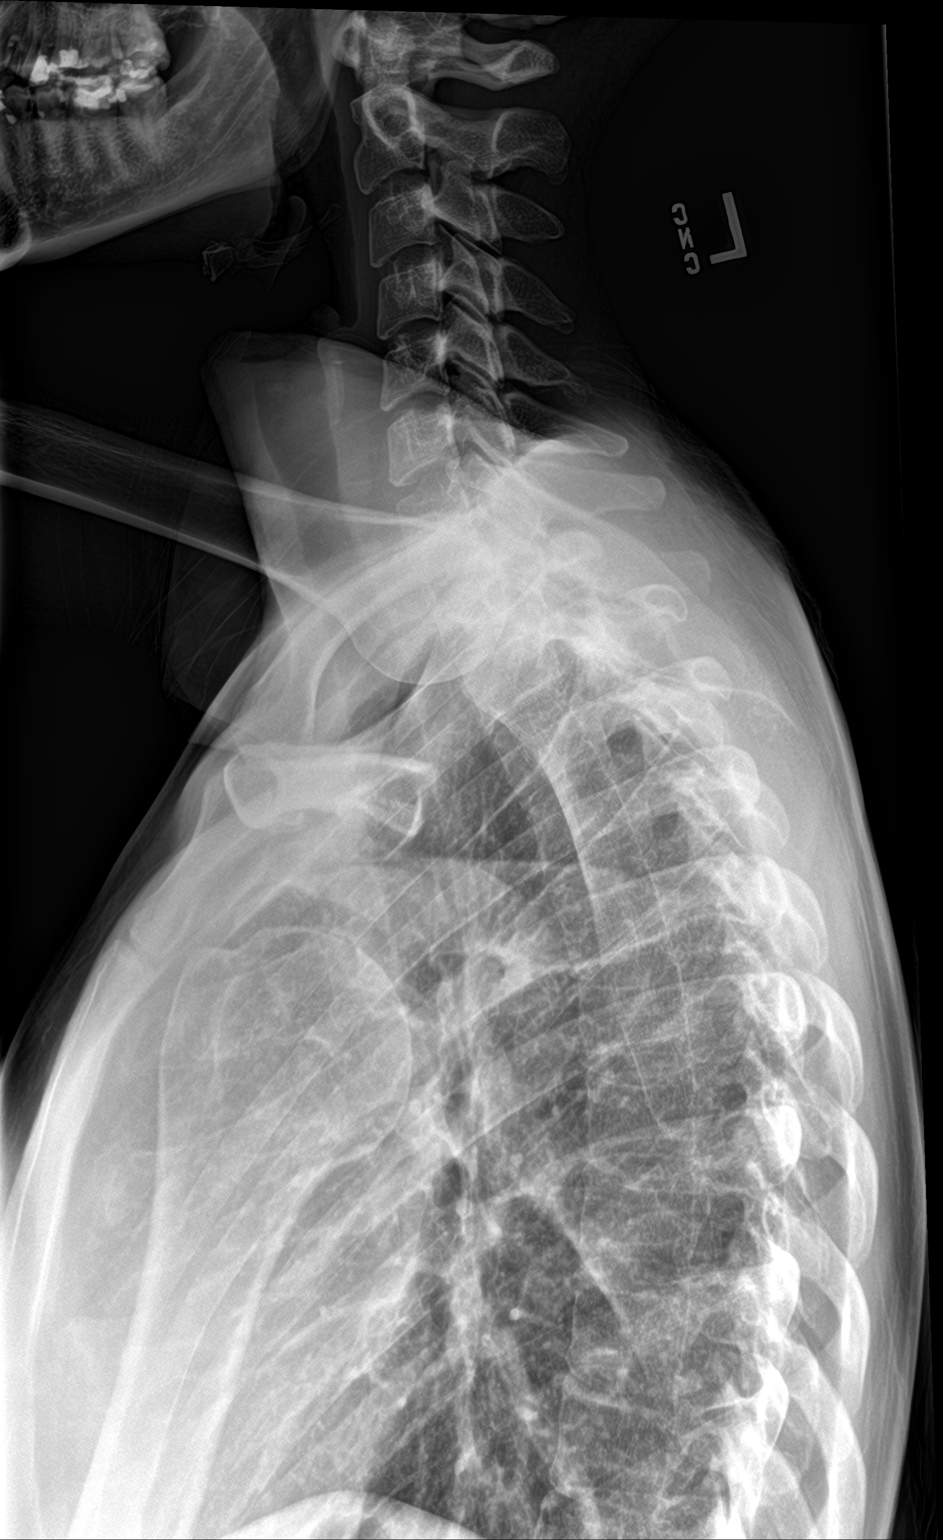

[3 of 3 positions shown; findings below may reference images not displayed]

FINDINGS: There is no evidence of thoracic spine fracture. Alignment is
normal. No other significant bone abnormalities are identified.
IMPRESSION: Negative.

## 2020-10-30 ENCOUNTER — Encounter: Payer: Medicaid Other | Admitting: Obstetrics and Gynecology

## 2020-11-21 ENCOUNTER — Encounter: Payer: Self-pay | Admitting: Obstetrics and Gynecology

## 2020-11-21 ENCOUNTER — Other Ambulatory Visit (HOSPITAL_COMMUNITY)
Admission: RE | Admit: 2020-11-21 | Discharge: 2020-11-21 | Disposition: A | Discharge status: Home / Self Care | Payer: Medicaid Other | Source: Ambulatory Visit | Attending: Obstetrics and Gynecology | Admitting: Obstetrics and Gynecology

## 2020-11-21 ENCOUNTER — Ambulatory Visit (INDEPENDENT_AMBULATORY_CARE_PROVIDER_SITE_OTHER): Payer: Medicaid Other | Admitting: Obstetrics and Gynecology

## 2020-11-21 ENCOUNTER — Other Ambulatory Visit: Payer: Self-pay

## 2020-11-21 VITALS — BP 109/66 | HR 76 | Resp 16 | Ht 66.0 in | Wt 128.6 lb

## 2020-11-21 DIAGNOSIS — Z1159 Encounter for screening for other viral diseases: Secondary | ICD-10-CM | POA: Diagnosis not present

## 2020-11-21 DIAGNOSIS — Z0000 Encounter for general adult medical examination without abnormal findings: Secondary | ICD-10-CM | POA: Diagnosis not present

## 2020-11-21 DIAGNOSIS — Z124 Encounter for screening for malignant neoplasm of cervix: Secondary | ICD-10-CM

## 2020-11-21 DIAGNOSIS — Z01419 Encounter for gynecological examination (general) (routine) without abnormal findings: Secondary | ICD-10-CM

## 2020-11-21 NOTE — Progress Notes (Signed)
HPI:      [name] is a 30 y.o. G1P0 who LMP was No LMP recorded.  Subjective:   She presents today for her annual examination.  She has no complaints.  She is taking OCPs and having normal regular cycles.  (She takes them mainly for her acne). Patient describes a remote history of abnormal Pap smears and she was told to follow-up for a "biopsy" which she never did.  She is now requesting a Pap smear and is willing to follow-up if necessary. 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  She has been off methadone for 3 years and is living a "new life" drug-free at this time. Patient does not use tobacco. Frequent hiking for exercise.    Hx: The following portions of the patient's history were reviewed and updated as appropriate:             She  has a past medical history of Anxiety, Asthma, and Depression. She does not have any pertinent problems on file. She  has a past surgical history that includes Wisdom tooth extraction. Her family history is not on file. She  reports that she has never smoked. She has never used smokeless tobacco. She reports current alcohol use. She reports current drug use. She has a current medication list which includes the following prescription(s): clonazepam, magnesium, and tri-lo-sprintec. She is allergic to amoxicillin-pot clavulanate and penicillins.       Review of Systems:  Review of Systems  Constitutional: Denied constitutional symptoms, night sweats, recent illness, fatigue, fever, insomnia and weight loss.  Eyes: Denied eye symptoms, eye pain, photophobia, vision change and visual disturbance.  Ears/Nose/Throat/Neck: Denied ear, nose, throat or neck symptoms, hearing loss, nasal discharge, sinus congestion and sore throat.  Cardiovascular: Denied cardiovascular symptoms, arrhythmia, chest pain/pressure, edema, exercise intolerance, orthopnea and palpitations.  Respiratory: Denied pulmonary symptoms,  asthma, pleuritic pain, productive sputum, cough, dyspnea and wheezing.  Gastrointestinal: Denied, gastro-esophageal reflux, melena, nausea and vomiting.  Genitourinary: Denied genitourinary symptoms including symptomatic vaginal discharge, pelvic relaxation issues, and urinary complaints.  Musculoskeletal: Denied musculoskeletal symptoms, stiffness, swelling, muscle weakness and myalgia.  Dermatologic: Denied dermatology symptoms, rash and scar.  Neurologic: Denied neurology symptoms, dizziness, headache, neck pain and syncope.  Psychiatric: Denied psychiatric symptoms, anxiety and depression.  Endocrine: Denied endocrine symptoms including hot flashes and night sweats.   Meds:   Current Outpatient Medications on File Prior to Visit  Medication Sig Dispense Refill  . clonazePAM (KLONOPIN) 1 MG tablet Take 1 mg by mouth daily as needed.    Marland Kitchen MAGNESIUM PO Take by mouth.    . TRI-LO-SPRINTEC 0.18/0.215/0.25 MG-25 MCG tab Take 1 tablet by mouth daily.     No current facility-administered medications on file prior to visit.       The pregnancy intention screening data noted above was reviewed. Potential methods of contraception were discussed. The patient elected to proceed with Oral Contraceptive.     Objective:     Vitals:   11/21/20 0910  BP: 109/66  Pulse: 76  Resp: 16    Filed Weights   11/21/20 0910  Weight: 128 lb 9.6 oz (58.3 kg)              Physical examination General NAD, Conversant  HEENT Atraumatic; Op clear with mmm.  Normo-cephalic. Pupils reactive. Anicteric sclerae  Thyroid/Neck Smooth without nodularity or enlargement. Normal ROM.  Neck Supple.  Skin No rashes, lesions or ulceration.  Normal palpated skin turgor. No nodularity.  Breasts: No masses or discharge.  Symmetric.  No axillary adenopathy.  Lungs: Clear to auscultation.No rales or wheezes. Normal Respiratory effort, no retractions.  Heart: NSR.  No murmurs or rubs appreciated. No periferal edema   Abdomen: Soft.  Non-tender.  No masses.  No HSM. No hernia  Extremities: Moves all appropriately.  Normal ROM for age. No lymphadenopathy.  Neuro: Oriented to PPT.  Normal mood. Normal affect.     Pelvic:   Vulva: Normal appearance.  No lesions.  Vagina: No lesions or abnormalities noted.  Support: Normal pelvic support.  Urethra No masses tenderness or scarring.  Meatus Normal size without lesions or prolapse.  Cervix: Normal appearance.  No lesions.  Anus: Normal exam.  No lesions.  Perineum: Normal exam.  No lesions.        Bimanual   Uterus: Normal size.  Non-tender.  Mobile.  AV.  Adnexae: No masses.  Non-tender to palpation.  Cul-de-sac: Negative for abnormality.     Assessment:    G1P0 Patient Active Problem List   Diagnosis Date Noted  . Increased nuchal translucency space on fetal ultrasound 12/26/2016  . [redacted] weeks gestation of pregnancy      1. Well woman exam with routine gynecological exam   2. Need for hepatitis C screening test   3. Cervical cancer screening        Plan:            1.  Basic Screening Recommendations The basic screening recommendations for asymptomatic women were discussed with the patient during her visit.  The age-appropriate recommendations were discussed with her and the rational for the tests reviewed.  When I am informed by the patient that another primary care physician has previously obtained the age-appropriate tests and they are up-to-date, only outstanding tests are ordered and referrals given as necessary.  Abnormal results of tests will be discussed with her when all of her results are completed.  Routine preventative health maintenance measures emphasized: Exercise/Diet/Weight control, Tobacco Warnings, Alcohol/Substance use risks and Stress Management Pap cotest Patient declined hep C because she "had hep C in the past"  Orders No orders of the defined types were placed in this encounter.   No orders of the defined types  were placed in this encounter.         F/U  Return in about 1 year (around 11/21/2021) for Annual Physical.  Finis Bud, M.D. 11/21/2020 9:46 AM

## 2020-11-23 LAB — CYTOLOGY - PAP
Comment: NEGATIVE
Diagnosis: NEGATIVE
High risk HPV: NEGATIVE

## 2020-12-28 ENCOUNTER — Ambulatory Visit (INDEPENDENT_AMBULATORY_CARE_PROVIDER_SITE_OTHER): Payer: Medicaid Other | Admitting: Dermatology

## 2020-12-28 ENCOUNTER — Other Ambulatory Visit: Payer: Self-pay

## 2020-12-28 DIAGNOSIS — L649 Androgenic alopecia, unspecified: Secondary | ICD-10-CM | POA: Diagnosis not present

## 2020-12-28 DIAGNOSIS — L219 Seborrheic dermatitis, unspecified: Secondary | ICD-10-CM | POA: Diagnosis not present

## 2020-12-28 MED ORDER — KETOCONAZOLE 2 % EX SHAM: MEDICATED_SHAMPOO | CUTANEOUS | 11 refills | Status: AC

## 2020-12-28 NOTE — Patient Instructions (Signed)

## 2020-12-28 NOTE — Progress Notes (Signed)
   Follow-Up Visit   Subjective  Deborah Hansen is a 30 y.o. female who presents for the following: follow up (Patient here today for to get refills on ketoconazole shampoo. Patient states she was having trouble with hair loss and was told to use minoxidil. She would like to follow up on medication. ).  The following portions of the chart were reviewed this encounter and updated as appropriate:  Tobacco  Allergies  Meds  Problems  Med Hx  Surg Hx  Fam Hx      Objective  Well appearing patient in no apparent distress; mood and affect are within normal limits.  A focused examination was performed including scalp. Relevant physical exam findings are noted in the Assessment and Plan.  Objective  Scalp: Mild scale and erythema   Objective  Scalp: Diffuse thinning frontal and vertex, mild  Assessment & Plan  Seborrheic dermatitis Scalp  Chronic condition with duration or expected duration over one year. Currently well-controlled.  Continue Ketoconazole 2 % shampoo apply three times per week, massage into scalp and leave in for 10 minutes before rinsing out     Reordered Medications ketoconazole (NIZORAL) 2 % shampoo  Androgenic alopecia Scalp  Continue minoxidil.  Recommend minoxidil 5% (Rogaine for men) solution or foam to be applied to the scalp and left in. This should ideally be used twice daily for best results but it helps with hair regrowth when used at least three times per week. Rogaine initially can cause increased hair shedding for the first few weeks but this will stop with continued use. In studies, people who used minoxidil (Rogaine) for at least 6 months had thicker hair than people who did not. Minoxidil topical (Rogaine) only works as long as it continues to be used. If if it is no longer used then the hair it has been helping to regrow can fall out. Minoxidil topical (Rogaine) can cause increased facial hair growth which can usually be managed easily with  a battery-operated hair trimmer. If facial hair growth is bothersome, switching to the 2% women's version can decrease the risk of unwanted facial hair growth.  Continue ketoconazole shampoo 2% apply three times per week, massage into scalp and leave in for 10 minutes before rinsing out   No follow-ups on file.  I, Ruthell Rummage, CMA, am acting as scribe for Forest Gleason, MD.  Documentation: I have reviewed the above documentation for accuracy and completeness, and I agree with the above.  Forest Gleason, MD

## 2021-01-09 ENCOUNTER — Encounter: Payer: Self-pay | Admitting: Dermatology

## 2021-03-06 ENCOUNTER — Telehealth: Payer: Self-pay | Admitting: Obstetrics and Gynecology

## 2021-03-06 NOTE — Telephone Encounter (Signed)
Patient is currently taking orthtrocycline low - was rx by PCP but wants to talk about complications with birth control.  Patient states recently seen by Dr.Evans. Pt is asking if she can just talk to someone or Dr.Evans rather than coming in. Please Advise.

## 2021-03-06 NOTE — Telephone Encounter (Signed)
Spoke with patient and if she does not take BC pill at the same time every day she starts to bleed. I have scheduled appointment to discuss with Dr. Amalia Hailey.

## 2021-03-07 ENCOUNTER — Ambulatory Visit (INDEPENDENT_AMBULATORY_CARE_PROVIDER_SITE_OTHER): Payer: Medicaid Other | Admitting: Obstetrics and Gynecology

## 2021-03-07 ENCOUNTER — Other Ambulatory Visit: Payer: Self-pay

## 2021-03-07 ENCOUNTER — Encounter: Payer: Self-pay | Admitting: Obstetrics and Gynecology

## 2021-03-07 VITALS — BP 107/71 | HR 74 | Ht 66.0 in | Wt 129.3 lb

## 2021-03-07 DIAGNOSIS — G43829 Menstrual migraine, not intractable, without status migrainosus: Secondary | ICD-10-CM | POA: Diagnosis not present

## 2021-03-07 DIAGNOSIS — N921 Excessive and frequent menstruation with irregular cycle: Secondary | ICD-10-CM | POA: Diagnosis not present

## 2021-03-07 MED ORDER — LEVONORGEST-ETH ESTRAD 91-DAY 0.15-0.03 &0.01 MG PO TABS: 1.0000 | ORAL_TABLET | Freq: Every day | ORAL | 1 refills | Status: DC

## 2021-03-07 NOTE — Progress Notes (Signed)
HPI:      Deborah Hansen is a 30 y.o. G1P1001 who LMP was Patient's last menstrual period was 02/27/2021.  Subjective:   She presents today with 2 issues.  The first is that if she takes her OCP even 1 hour late she has breakthrough bleeding the next day.  She is concerned with having to be so exacting in taking the pill.  She is primarily taking the OCP to help control acne. She also has menstrual migraines after she discontinues her OCPs for the placebo week.  She denies aura with her migraines.    Hx: The following portions of the patient's history were reviewed and updated as appropriate:             She  has a past medical history of Anxiety, Asthma, and Depression. She does not have any pertinent problems on file. She  has a past surgical history that includes Wisdom tooth extraction. Her family history includes Breast cancer in her maternal aunt and maternal grandmother. She  reports that she has never smoked. She has never used smokeless tobacco. She reports current alcohol use. She reports previous drug use. She has a current medication list which includes the following prescription(s): clonazepam, ketoconazole, levonorgestrel-ethinyl estradiol, and magnesium. She is allergic to amoxicillin-pot clavulanate and penicillins.       Review of Systems:  Review of Systems  Constitutional: Denied constitutional symptoms, night sweats, recent illness, fatigue, fever, insomnia and weight loss.  Eyes: Denied eye symptoms, eye pain, photophobia, vision change and visual disturbance.  Ears/Nose/Throat/Neck: Denied ear, nose, throat or neck symptoms, hearing loss, nasal discharge, sinus congestion and sore throat.  Cardiovascular: Denied cardiovascular symptoms, arrhythmia, chest pain/pressure, edema, exercise intolerance, orthopnea and palpitations.  Respiratory: Denied pulmonary symptoms, asthma, pleuritic pain, productive sputum, cough, dyspnea and wheezing.  Gastrointestinal: Denied,  gastro-esophageal reflux, melena, nausea and vomiting.  Genitourinary: See HPI for additional information.  Musculoskeletal: Denied musculoskeletal symptoms, stiffness, swelling, muscle weakness and myalgia.  Dermatologic: Denied dermatology symptoms, rash and scar.  Neurologic: See HPI for additional information.  Psychiatric: Denied psychiatric symptoms, anxiety and depression.  Endocrine: Denied endocrine symptoms including hot flashes and night sweats.   Meds:   Current Outpatient Medications on File Prior to Visit  Medication Sig Dispense Refill  . clonazePAM (KLONOPIN) 1 MG tablet Take 1 mg by mouth daily as needed.    Marland Kitchen ketoconazole (NIZORAL) 2 % shampoo LATHER 3 TIMES WEEKLY LEAVE ON 8 TO 10 MINUTES AND THEN RINSE 120 mL 11  . MAGNESIUM PO Take by mouth.     No current facility-administered medications on file prior to visit.       The pregnancy intention screening data noted above was reviewed. Potential methods of contraception were discussed. The patient elected to proceed with Oral Contraceptive.     Objective:     Vitals:   03/07/21 1421  BP: 107/71  Pulse: 74   Filed Weights   03/07/21 1421  Weight: 129 lb 4.8 oz (58.7 kg)                Assessment:    G1P1001 Patient Active Problem List   Diagnosis Date Noted  . Increased nuchal translucency space on fetal ultrasound 12/26/2016  . [redacted] weeks gestation of pregnancy      1. Breakthrough bleeding on OCPs   2. Menstrual migraine without status migrainosus, not intractable        Plan:  1.  We had discussed OCPs in great detail.  Estrogen dominant versus progesterone dominant pills discussed.  Option of receiving some hormonal dose during her menstrual period to alleviate migraines discussed.  Option of menses every 3 months to help alleviate migraines as well as give cycle control discussed.  Patient has chosen to begin [place].  She will finish out her current pack and then begin  [facility] as directed.  Orders No orders of the defined types were placed in this encounter.    Meds ordered this encounter  Medications  . Levonorgestrel-Ethinyl Estradiol (AMETHIA) 0.15-0.03 &0.01 MG tablet    Sig: Take 1 tablet by mouth at bedtime.    Dispense:  84 tablet    Refill:  1      F/U  Return for Annual Physical. I spent 26 minutes involved in the care of this patient preparing to see the patient by obtaining and reviewing her medical history (including labs, imaging tests and prior procedures), documenting clinical information in the electronic health record (EHR), counseling and coordinating care plans, writing and sending prescriptions, ordering tests or procedures and directly communicating with the patient by discussing pertinent items from her history and physical exam as well as detailing my assessment and plan as noted above so that she has an informed understanding.  All of her questions were answered.  Finis Bud, M.D. 03/07/2021 3:11 PM

## 2021-03-09 ENCOUNTER — Telehealth: Payer: Self-pay | Admitting: Obstetrics and Gynecology

## 2021-03-09 NOTE — Telephone Encounter (Signed)
Deborah Hansen called in and states she is still bleeding.  She states she went ahead and started the new pack of birth control that Dr. Amalia Hailey prescribed. Deborah Hansen states she has been bleeding for a week and a half.  Deborah Hansen states even with starting the new birth control 2 days ago she is still bleeding.  She wanted to know if this is normal or should she stop taking the birth control and wait until she is done bleeding to start the birth control.  Please advise.

## 2021-03-15 NOTE — Telephone Encounter (Signed)
LM for patient to return call.

## 2021-03-30 ENCOUNTER — Telehealth: Payer: Self-pay | Admitting: Obstetrics and Gynecology

## 2021-03-30 NOTE — Telephone Encounter (Signed)
error 

## 2021-04-13 ENCOUNTER — Ambulatory Visit (INDEPENDENT_AMBULATORY_CARE_PROVIDER_SITE_OTHER): Payer: Medicaid Other | Admitting: Obstetrics and Gynecology

## 2021-04-13 ENCOUNTER — Encounter: Payer: Self-pay | Admitting: Obstetrics and Gynecology

## 2021-04-13 ENCOUNTER — Other Ambulatory Visit: Payer: Self-pay

## 2021-04-13 VITALS — Ht 66.0 in

## 2021-04-13 DIAGNOSIS — Z3009 Encounter for other general counseling and advice on contraception: Secondary | ICD-10-CM | POA: Diagnosis not present

## 2021-04-13 DIAGNOSIS — G43829 Menstrual migraine, not intractable, without status migrainosus: Secondary | ICD-10-CM | POA: Diagnosis not present

## 2021-04-13 MED ORDER — DESOGESTREL-ETHINYL ESTRADIOL 0.15-0.02/0.01 MG (21/5) PO TABS: 1.0000 | ORAL_TABLET | Freq: Every day | ORAL | 2 refills | Status: DC

## 2021-04-13 NOTE — Progress Notes (Signed)
HPI:      Deborah Hansen is a 30 y.o. G1P1001 who LMP was No LMP recorded.  Subjective:   She presents today because she is unhappy with her OCPs.  1 month into her 42-month OCPs her bleeding was well controlled however she had an IBS flare and began having significant back pain.  She attributes this to the timing of her menstrual period and lack of menses because of the 12-month pill. She would like to go back to a monthly cyclic pill. She is trying to accomplish 3 things:  Occasional use for birth control  Prevention of menstrual migraines  Cycle control with no breakthrough bleeding if she takes a pill a few hours late.    Hx: The following portions of the patient's history were reviewed and updated as appropriate:             She  has a past medical history of Anxiety, Asthma, and Depression. She does not have any pertinent problems on file. She  has a past surgical history that includes Wisdom tooth extraction. Her family history includes Breast cancer in her maternal aunt and maternal grandmother. She  reports that she has never smoked. She has never used smokeless tobacco. She reports current alcohol use. She reports previous drug use. She has a current medication list which includes the following prescription(s): clonazepam, desogestrel-ethinyl estradiol, ketoconazole, levonorgestrel-ethinyl estradiol, and magnesium. She is allergic to amoxicillin-pot clavulanate and penicillins.       Review of Systems:  Review of Systems  Constitutional: Denied constitutional symptoms, night sweats, recent illness, fatigue, fever, insomnia and weight loss.  Eyes: Denied eye symptoms, eye pain, photophobia, vision change and visual disturbance.  Ears/Nose/Throat/Neck: Denied ear, nose, throat or neck symptoms, hearing loss, nasal discharge, sinus congestion and sore throat.  Cardiovascular: Denied cardiovascular symptoms, arrhythmia, chest pain/pressure, edema, exercise intolerance,  orthopnea and palpitations.  Respiratory: Denied pulmonary symptoms, asthma, pleuritic pain, productive sputum, cough, dyspnea and wheezing.  Gastrointestinal: Denied, gastro-esophageal reflux, melena, nausea and vomiting.  Genitourinary: Denied genitourinary symptoms including symptomatic vaginal discharge, pelvic relaxation issues, and urinary complaints.  Musculoskeletal: Denied musculoskeletal symptoms, stiffness, swelling, muscle weakness and myalgia.  Dermatologic: Denied dermatology symptoms, rash and scar.  Neurologic: Denied neurology symptoms, dizziness, headache, neck pain and syncope.  Psychiatric: Denied psychiatric symptoms, anxiety and depression.  Endocrine: Denied endocrine symptoms including hot flashes and night sweats.   Meds:   Current Outpatient Medications on File Prior to Visit  Medication Sig Dispense Refill   clonazePAM (KLONOPIN) 1 MG tablet Take 1 mg by mouth daily as needed.     ketoconazole (NIZORAL) 2 % shampoo LATHER 3 TIMES WEEKLY LEAVE ON 8 TO 10 MINUTES AND THEN RINSE (Patient not taking: Reported on 04/13/2021) 120 mL 11   Levonorgestrel-Ethinyl Estradiol (AMETHIA) 0.15-0.03 &0.01 MG tablet Take 1 tablet by mouth at bedtime. (Patient not taking: Reported on 04/13/2021) 84 tablet 1   MAGNESIUM PO Take by mouth. (Patient not taking: Reported on 04/13/2021)     No current facility-administered medications on file prior to visit.        The pregnancy intention screening data noted above was reviewed. Potential methods of contraception were discussed. The patient elected to proceed with Oral Contraceptive.     Objective:     There were no vitals filed for this visit. There were no vitals filed for this visit.              Assessment:    G1P1001 Patient Active  Problem List   Diagnosis Date Noted   Increased nuchal translucency space on fetal ultrasound 12/26/2016   [redacted] weeks gestation of pregnancy      1. Birth control counseling   2. Menstrual  migraine without status migrainosus, not intractable     See history above   Plan:            1.  We will change to monthly cyclic pill.  We will pick an intermediate pill for cycle control which will also help her with acne and give her some hormone during the off week to attempt to stop menstrual migraines.  [place] prescribed Orders No orders of the defined types were placed in this encounter.    Meds ordered this encounter  Medications   desogestrel-ethinyl estradiol (MIRCETTE) 0.15-0.02/0.01 MG (21/5) tablet    Sig: Take 1 tablet by mouth at bedtime.    Dispense:  84 tablet    Refill:  2      F/U  Return for Annual Physical. I spent 22 minutes involved in the care of this patient preparing to see the patient by obtaining and reviewing her medical history (including labs, imaging tests and prior procedures), documenting clinical information in the electronic health record (EHR), counseling and coordinating care plans, writing and sending prescriptions, ordering tests or procedures and directly communicating with the patient by discussing pertinent items from her history and physical exam as well as detailing my assessment and plan as noted above so that she has an informed understanding.  All of her questions were answered.  Finis Bud, M.D. 04/13/2021 10:13 AM

## 2021-08-06 DIAGNOSIS — G43829 Menstrual migraine, not intractable, without status migrainosus: Secondary | ICD-10-CM | POA: Insufficient documentation

## 2023-02-04 ENCOUNTER — Other Ambulatory Visit (HOSPITAL_COMMUNITY)
Admission: RE | Admit: 2023-02-04 | Discharge: 2023-02-04 | Disposition: A | Discharge status: Home / Self Care | Payer: Medicaid Other | Source: Ambulatory Visit | Attending: Obstetrics & Gynecology | Admitting: Obstetrics & Gynecology

## 2023-02-04 ENCOUNTER — Encounter: Payer: Self-pay | Admitting: Obstetrics & Gynecology

## 2023-02-04 ENCOUNTER — Ambulatory Visit (INDEPENDENT_AMBULATORY_CARE_PROVIDER_SITE_OTHER): Payer: Medicaid Other | Admitting: Obstetrics & Gynecology

## 2023-02-04 VITALS — BP 100/60 | Ht 65.0 in | Wt 130.0 lb

## 2023-02-04 DIAGNOSIS — N926 Irregular menstruation, unspecified: Secondary | ICD-10-CM

## 2023-02-04 DIAGNOSIS — Z803 Family history of malignant neoplasm of breast: Secondary | ICD-10-CM

## 2023-02-04 DIAGNOSIS — Z01419 Encounter for gynecological examination (general) (routine) without abnormal findings: Secondary | ICD-10-CM | POA: Diagnosis present

## 2023-02-04 DIAGNOSIS — Z124 Encounter for screening for malignant neoplasm of cervix: Secondary | ICD-10-CM | POA: Insufficient documentation

## 2023-02-04 NOTE — Progress Notes (Signed)
GYNECOLOGY ANNUAL PHYSICAL EXAM PROGRESS NOTE  Subjective:    Deborah Hansen is a 32 y.o. single G95P1001 (32 yo) who presents for an annual exam. The patient has no complaints today. The patient is sexually active, but only oral sex due to his PE. The patient participates in regular exercise: yes. Has the patient ever been transfused or tattooed?: yes. The patient reports that there is not domestic violence in her life.   Her most recent periods have been normal, but she had a few months of her periods coming early.   Menstrual History:  Patient's last menstrual period was 01/25/2023. Period Cycle (Days): 28 Period Duration (Days): 4 Period Pattern: Regular Menstrual Flow: Light Dysmenorrhea: (!) Mild Dysmenorrhea Symptoms: Cramping   Gynecologic History:  Contraception: abstinence History of STI's:  Last Pap: 2022. Results were: normal.  Notes h/o abnormal pap smears requiring biopsy.  FH- + breast cancer in maternal aunt, maternal GM, maternal great aunt       OB History  Gravida Para Term Preterm AB Living  1 1 1  0 0 1  SAB IAB Ectopic Multiple Live Births  0 0 0 0 1    # Outcome Date GA Lbr Len/2nd Weight Sex Delivery Anes PTL Lv  1 Term 2018   5 lb 2.1 oz (2.327 kg) M Vag-Spont   LIV    Past Medical History:  Diagnosis Date   Anxiety    Asthma    Depression     Past Surgical History:  Procedure Laterality Date   WISDOM TOOTH EXTRACTION      Family History  Problem Relation Age of Onset   Breast cancer Maternal Aunt    Breast cancer Maternal Grandmother    Ovarian cancer Neg Hx    Colon cancer Neg Hx     Social History   Socioeconomic History   Marital status: Single    Spouse name: Not on file   Number of children: Not on file   Years of education: Not on file   Highest education level: Not on file  Occupational History   Not on file  Tobacco Use   Smoking status: Never   Smokeless tobacco: Never  Substance and Sexual Activity    Alcohol use: Yes    Comment: RARE   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     ketoconazole (NIZORAL) 2 % shampoo LATHER 3 TIMES WEEKLY LEAVE ON 8 TO 10 MINUTES AND THEN RINSE 120 mL 11   desogestrel-ethinyl estradiol (MIRCETTE) 0.15-0.02/0.01 MG (21/5) tablet Take 1 tablet by mouth at bedtime. (Patient not taking: Reported on 02/04/2023) 84 tablet 2   Levonorgestrel-Ethinyl Estradiol (AMETHIA) 0.15-0.03 &0.01 MG tablet Take 1 tablet by mouth at bedtime. (Patient not taking: Reported on 04/13/2021) 84 tablet 1   MAGNESIUM PO Take by mouth. (Patient not taking: Reported on  04/13/2021)     No current facility-administered medications on file prior to visit.    Allergies  Allergen Reactions   Amoxicillin-Pot Clavulanate Hives   Penicillins      Review of Systems Constitutional: negative for chills, fatigue, fevers and sweats Eyes: negative for irritation, redness and visual disturbance Ears, nose, mouth, throat, and face: negative for hearing loss, nasal congestion, snoring and tinnitus Respiratory: negative for asthma, cough, sputum Cardiovascular: negative for chest pain, dyspnea, exertional chest pressure/discomfort, irregular heart beat, palpitations and syncope Gastrointestinal: negative for abdominal pain, change in bowel habits, nausea and vomiting Genitourinary: negative for abnormal menstrual periods, genital  lesions, sexual problems and vaginal discharge, dysuria and urinary incontinence Integument/breast: negative for breast lump, breast tenderness and nipple discharge Hematologic/lymphatic: negative for bleeding and easy bruising Musculoskeletal:negative for back pain and muscle weakness Neurological: negative for dizziness, headaches, vertigo and weakness Endocrine: negative for diabetic symptoms including polydipsia, polyuria and skin dryness Allergic/Immunologic: negative for hay fever and urticaria      Objective:  Blood pressure 100/60, height  (1.651 m), weight 130 lb (59 kg), last menstrual period 01/25/2023. Body mass index is 21.63 kg/m.    General Appearance:    Alert, cooperative, no distress, appears stated age  Head:    Normocephalic, without obvious abnormality, atraumatic  Eyes:    PERRL, conjunctiva/corneas clear, EOM's intact, both eyes  Ears:    Normal external ear canals, both ears  Nose:   Nares normal, septum midline, mucosa normal, no drainage or sinus tenderness  Throat:   Lips, mucosa, and tongue normal; teeth and gums normal  Neck:   Supple, symmetrical, trachea midline, no adenopathy; thyroid: no enlargement/tenderness/nodules; no carotid bruit or JVD  Back:     Symmetric, no curvature, ROM normal, no CVA tenderness  Lungs:     Clear to auscultation bilaterally, respirations unlabored  Chest Wall:    No tenderness or deformity   Heart:    Regular rate and rhythm, S1 and S2 normal, no murmur, rub or gallop  Breast Exam:    No tenderness, masses, or nipple abnormality  Abdomen:     Soft, non-tender, bowel sounds active all four quadrants, no masses, no organomegaly.    Genitalia:    Pelvic:external genitalia normal, vagina without lesions, discharge, or tenderness, rectovaginal septum  normal. Cervix normal in appearance, no cervical motion tenderness, no adnexal masses or tenderness.  Uterus normal size, shape, mobile, regular contours, nontender.  Rectal:     Normal external sphincter.  No hemorrhoids appreciated. Internal exam not done.   Extremities:   Extremities normal, atraumatic, no cyanosis or edema  Pulses:   2+ and symmetric all extremities  Skin:   Skin color, texture, turgor normal, no rashes or lesions  Lymph nodes:   Cervical, supraclavicular, and axillary nodes normal  Neurologic:   CNII-XII intact, normal strength, sensation and reflexes throughout      Assessment:   1. Well woman exam with routine gynecological exam   2. Screening for cervical cancer   3.      Ovulation pain with LMP- I offered to restart her OCPs, but she prefers to deal with the pain. 4.      Strong FH of breast cancer- I offered genetic testing which she wants 5.      A few irregular periods- I have given her reassurance, but will check TSH/free T4   Plan:   Pap smear ordered (due to her h/o abnormals).  Follow up in 1 year  for annual exam   Allie Bossier, MD [facility]

## 2023-02-05 LAB — TSH+FREE T4
Free T4: 1.12 ng/dL (ref 0.82–1.77)
TSH: 1.82 u[IU]/mL (ref 0.450–4.500)

## 2023-02-07 LAB — CYTOLOGY - PAP
Adequacy: ABSENT
Diagnosis: NEGATIVE

## 2023-02-12 DIAGNOSIS — Z9189 Other specified personal risk factors, not elsewhere classified: Secondary | ICD-10-CM

## 2023-02-12 DIAGNOSIS — Z1371 Encounter for nonprocreative screening for genetic disease carrier status: Secondary | ICD-10-CM

## 2023-02-12 HISTORY — DX: Other specified personal risk factors, not elsewhere classified: Z91.89

## 2023-02-12 HISTORY — DX: Encounter for nonprocreative screening for genetic disease carrier status: Z13.71

## 2023-02-18 ENCOUNTER — Encounter: Payer: Self-pay | Admitting: Obstetrics and Gynecology

## 2023-03-06 ENCOUNTER — Telehealth: Payer: Self-pay | Admitting: Obstetrics and Gynecology

## 2023-03-06 NOTE — Telephone Encounter (Signed)
LMTRC twice re: results

## 2023-04-03 ENCOUNTER — Encounter: Payer: Self-pay | Admitting: Obstetrics and Gynecology

## 2023-04-03 ENCOUNTER — Other Ambulatory Visit: Payer: Self-pay | Admitting: Obstetrics and Gynecology

## 2023-04-03 DIAGNOSIS — Z803 Family history of malignant neoplasm of breast: Secondary | ICD-10-CM

## 2023-04-03 DIAGNOSIS — Z9189 Other specified personal risk factors, not elsewhere classified: Secondary | ICD-10-CM

## 2023-04-03 DIAGNOSIS — Z1231 Encounter for screening mammogram for malignant neoplasm of breast: Secondary | ICD-10-CM

## 2023-04-03 NOTE — Progress Notes (Signed)
Mammo order due to FH/increased risk of breast cancer.

## 2023-04-03 NOTE — Telephone Encounter (Signed)
UTR pt. Sent her letter with [facility] results. Placed mammo order due to increased risk of breast cancer recommendations.   April 03, 2023     Lainy Staus [address] [place] [address]     Dear Ms. Willy,   I have tried to reach you several times regarding your cancer genetic testing results done with Dr. Marice Potter in April 2024. Your tests show you are negative for 48 genes related to breast, ovarian and colon cancers. This doesn't mean you will never get any of these cancers, but you don't have a genetic mutation that significantly increases your risk of these cancers. With that said, you are still at increased risk of breast cancer based on your family history. Two computer models calculate your lifetime risk of breast cancer as 21.3% and 30.5%. Once a woman's lifetime risk is over 20%, then additional breast cancer screening options are recommended, such as starting mammograms at age 68, doing monthly self-breast exams, having a clinical breast exam (in the office) yearly and screening breast MRIs yearly.    Since you have not had a mammogram yet, I put an order in for you to schedule at your convenience. [facility]: [phone]). I am also including your copy of these results for your records. You have access to a genetic counselor at [facility] (at no charge to you) if you would like to discuss this further (phone number circled on lab results) or you can call me at the office.    Please let me know if you have any further questions after reviewing your results.      Sincerely,       Althea Grimmer, PA-C

## 2024-06-22 ENCOUNTER — Telehealth: Payer: Self-pay

## 2024-06-22 NOTE — Progress Notes (Signed)
 Patient called requesting yeast medication with symptoms of yeast infection reported after take BV treatment. Rx sent to pharmacy. -ER if symptoms increase, Primary care if symptoms do not improve

## 2024-06-22 NOTE — Telephone Encounter (Signed)
 Pt was having question has std check elsewhere and just wanted ore info on what Bacterial Vaginosis (BV) and is caused by an imbalance in the normal bacteria found in the vagina. Normally, the vagina contains mostly lactobacilli (good bacteria) that help maintain an acidic environment and prevent overgrowth of harmful bacteria. BV occurs when there is a decrease in lactobacilli and an overgrowth of other types of bacteria, such as Gardnerella vaginalis.  Common Causes & Risk Factors:  While BV isn't considered a traditional sexually transmitted infection (STI), certain behaviors and conditions can increase the risk:  1. Sexual activity  Having new or multiple sexual partners  Having sex without condoms  Sexual activity with other women (sharing vaginal flora)  2. Douching  Douching disrupts the natural balance of vaginal bacteria and increases the risk of BV.  3. Lack of lactobacilli  Some women naturally have fewer protective lactobacilli, making them more prone to BV.  4. Smoking  Smoking has been associated with increased risk of BV.  5. Other hygiene products  Use of scented soaps, vaginal sprays, or other products that alter vaginal pH can contribute to imbalance.

## 2024-07-21 ENCOUNTER — Ambulatory Visit: Admitting: Obstetrics & Gynecology

## 2024-08-05 NOTE — Progress Notes (Unsigned)
 ANNUAL EXAM Patient name: Deborah Hansen MRN [id number]  Date of birth: Nov 10, 1990 Chief Complaint:   Gynecologic Exam  History of Present Illness:   Deborah Hansen is a 33 y.o. G50P1001 Caucasian female being seen today for a routine annual exam.  Current complaints: none, has questions about recent STI screening when she was told she had BV but did not have any symptoms, also trichomonas was not screened for and she would like to be screened today.  Patient's last menstrual period was 07/30/2024.   Period Cycle (Days): 28 Period Duration (Days): 4 Period Pattern: Regular Menstrual Flow: Moderate Menstrual Control: Tampon Menstrual Control Change Freq (Hours): 3-4 Dysmenorrhea: (!) Mild Dysmenorrhea Symptoms: Headache   The pregnancy intention screening data noted above was reviewed. Potential methods of contraception were discussed. The patient elected to proceed with No data recorded.      Component Value Date/Time   DIAGPAP  02/04/2023 1108    - Negative for intraepithelial lesion or malignancy (NILM)   DIAGPAP  11/21/2020 0946    - Negative for intraepithelial lesion or malignancy (NILM)   HPVHIGH Negative 11/21/2020 0946   ADEQPAP  02/04/2023 1108    Satisfactory for evaluation; transformation zone component ABSENT.   ADEQPAP  11/21/2020 0946    Satisfactory for evaluation; transformation zone component PRESENT.      Last pap 02/04/23. Results were: NILM w/ HRHPV negative. H/O abnormal pap: yes Last mammogram: NA. Results were: N/A. Family h/o breast cancer: yes Maternal Grandmother, Maternal Great aunts Last colonoscopy: NA. Results were: N/A. Family h/o colorectal cancer: no     11/21/2020    9:16 AM  Depression screen PHQ 2/9  Decreased Interest 0  Down, Depressed, Hopeless 0  PHQ - 2 Score 0  Altered sleeping 0  Tired, decreased energy 0  Change in appetite 0  Feeling bad or failure about yourself  0  Trouble concentrating 0  Moving slowly or  fidgety/restless 0  Suicidal thoughts 0  PHQ-9 Score 0  Negative depression screen      11/21/2020    9:16 AM  GAD 7 : Generalized Anxiety Score  Nervous, Anxious, on Edge 1  Control/stop worrying 1  Worry too much - different things 1  Trouble relaxing 1  Restless 0  Easily annoyed or irritable 0  Afraid - awful might happen 1  Total GAD 7 Score 5  Anxiety Difficulty Somewhat difficult  Negative anxiety screen  Continue care with established psychiatric provider & counselor.    Past Medical History:  Diagnosis Date   Anxiety    Asthma    BRCA negative 02/2023   MyRisk neg   Depression    Family history of breast cancer    Increased risk of breast cancer 02/2023   IBIS=21.3%/riskscore=30.5%    Family History  Problem Relation Age of Onset   Breast cancer Maternal Aunt    Breast cancer Maternal Grandmother    Ovarian cancer Neg Hx    Colon cancer Neg Hx    Review of Systems:   Pertinent items are noted in HPI Denies any headaches, blurred vision, fatigue, shortness of breath, chest pain, abdominal pain, abnormal vaginal discharge/itching/odor/irritation, problems with periods, bowel movements, urination, or intercourse unless otherwise stated above. Pertinent History Reviewed:  Reviewed past medical,surgical, social and family history.  Reviewed problem list, medications and allergies. Physical Assessment:   Vitals:   08/09/24 1326  BP: 107/67  Pulse: 69  Weight: 137 lb 3.2 oz (62.2 kg)  Height: 5' 5 (  1.651 m)  Body mass index is 22.83 kg/m.       Physical Exam Vitals reviewed.  Constitutional:      General: She is not in acute distress.    Appearance: Normal appearance.  HENT:     Head: Normocephalic.  Neck:     Thyroid: No thyroid mass or thyromegaly.  Cardiovascular:     Rate and Rhythm: Normal rate and regular rhythm.     Heart sounds: Normal heart sounds.  Pulmonary:     Effort: Pulmonary effort is normal.     Breath sounds: Normal breath  sounds.  Chest:  Breasts:    Tanner Score is 5.     Right: Normal.     Left: Normal.  Abdominal:     General: Abdomen is flat.     Palpations: Abdomen is soft.     Tenderness: There is no abdominal tenderness.  Genitourinary:    General: Normal vulva.     Tanner stage (genital): 5.     Vagina: Normal.     Cervix: Normal.  Musculoskeletal:     Cervical back: Neck supple. No tenderness.  Lymphadenopathy:     Upper Body:     Right upper body: No axillary adenopathy.     Left upper body: No axillary adenopathy.  Skin:    General: Skin is warm and dry.  Neurological:     General: No focal deficit present.     Mental Status: She is alert and oriented to person, place, and time.  Psychiatric:        Mood and Affect: Mood normal.        Behavior: Behavior normal.      No results found for this or any previous visit (from the past 24 hours).  Assessment & Plan:  1. Well woman exam with routine gynecological exam (Primary) - Cytology - PAP - Cervicovaginal ancillary only - HIV Antibody (routine testing w rflx) - RPR  2. Family history of breast cancer - MM 3D SCREENING MAMMOGRAM BILATERAL BREAST; Future  3. Increased risk of breast cancer - MM 3D SCREENING MAMMOGRAM BILATERAL BREAST; Future  4. Encounter for screening mammogram for malignant neoplasm of breast - MM 3D SCREENING MAMMOGRAM BILATERAL BREAST; Future  5. Screening for cervical cancer - Cytology - PAP  6. Encounter for screening for human papillomavirus (HPV) - Cytology - PAP  7. Screening for human immunodeficiency virus - HIV Antibody (routine testing w rflx)  8. Screen for sexually transmitted diseases - Cervicovaginal ancillary only - HIV Antibody (routine testing w rflx) - RPR    Mammogram: ordered to be completed now given family history Colonoscopy: @ 33yo, or sooner if problems  Orders Placed This Encounter  Procedures   MM 3D SCREENING MAMMOGRAM BILATERAL BREAST   HIV Antibody (routine  testing w rflx)   RPR    Meds: No orders of the defined types were placed in this encounter.   Follow-up: Return in 1 year (on 08/09/2025) for Annual exam.  Deborah Hansen, CNM 08/09/2024 1:27 PM

## 2024-08-05 NOTE — Patient Instructions (Addendum)
 Preventive Care 74-35 Years Old, Female Preventive care refers to lifestyle choices and visits with your health care provider that can promote health and wellness. Preventive care visits are also called wellness exams. What can I expect for my preventive care visit? Counseling During your preventive care visit, your health care provider may ask about your: Medical history, including: Past medical problems. Family medical history. Pregnancy history. Current health, including: Menstrual cycle. Method of birth control. Emotional well-being. Home life and relationship well-being. Sexual activity and sexual health. Lifestyle, including: Alcohol, nicotine or tobacco, and drug use. Access to firearms. Diet, exercise, and sleep habits. Work and work Astronomer. Sunscreen use. Safety issues such as seatbelt and bike helmet use. Physical exam Your health care provider may check your: Height and weight. These may be used to calculate your BMI (body mass index). BMI is a measurement that tells if you are at a healthy weight. Waist circumference. This measures the distance around your waistline. This measurement also tells if you are at a healthy weight and may help predict your risk of certain diseases, such as type 2 diabetes and high blood pressure. Heart rate and blood pressure. Body temperature. Skin for abnormal spots. What immunizations do I need?  Vaccines are usually given at various ages, according to a schedule. Your health care provider will recommend vaccines for you based on your age, medical history, and lifestyle or other factors, such as travel or where you work. What tests do I need? Screening Your health care provider may recommend screening tests for certain conditions. This may include: Pelvic exam and Pap test. Lipid and cholesterol levels. Diabetes screening. This is done by checking your blood sugar (glucose) after you have not eaten for a while (fasting). Hepatitis  B test. Hepatitis C test. HIV (human immunodeficiency virus) test. STI (sexually transmitted infection) testing, if you are at risk. BRCA-related cancer screening. This may be done if you have a family history of breast, ovarian, tubal, or peritoneal cancers. Talk with your health care provider about your test results, treatment options, and if necessary, the need for more tests. Follow these instructions at home: Eating and drinking  Eat a healthy diet that includes fresh fruits and vegetables, whole grains, lean protein, and low-fat dairy products. Take vitamin and mineral supplements as recommended by your health care provider. Do not drink alcohol if: Your health care provider tells you not to drink. You are pregnant, may be pregnant, or are planning to become pregnant. If you drink alcohol: Limit how much you have to 0-1 drink a day. Know how much alcohol is in your drink. In the [place] one drink equals one 12 oz bottle of beer (355 mL), one 5 oz glass of wine (148 mL), or one 1 oz glass of hard liquor (44 mL). Lifestyle Brush your teeth every morning and night with fluoride toothpaste. Floss one time each day. Exercise for at least 30 minutes 5 or more days each week. Do not use any products that contain nicotine or tobacco. These products include cigarettes, chewing tobacco, and vaping devices, such as e-cigarettes. If you need help quitting, ask your health care provider. Do not use drugs. If you are sexually active, practice safe sex. Use a condom or other form of protection to prevent STIs. If you do not wish to become pregnant, use a form of birth control. If you plan to become pregnant, see your health care provider for a prepregnancy visit. Find healthy ways to manage stress, such as: Meditation,  yoga, or listening to music. Journaling. Talking to a trusted person. Spending time with friends and family. Minimize exposure to UV radiation to reduce your risk of skin  cancer. Safety Always wear your seat belt while driving or riding in a vehicle. Do not drive: If you have been drinking alcohol. Do not ride with someone who has been drinking. If you have been using any mind-altering substances or drugs. While texting. When you are tired or distracted. Wear a helmet and other protective equipment during sports activities. If you have firearms in your house, make sure you follow all gun safety procedures. Seek help if you have been physically or sexually abused. What's next? Go to your health care provider once a year for an annual wellness visit. Ask your health care provider how often you should have your eyes and teeth checked. Stay up to date on all vaccines. This information is not intended to replace advice given to you by your health care provider. Make sure you discuss any questions you have with your health care provider. Document Revised: 03/28/2021 Document Reviewed: 03/28/2021 Elsevier Patient Education  2024 [employer] How to Do a Breast Self-Exam Doing breast self-exams can help you stay healthy. They're one way to know what's normal for your breasts. They can help you catch a problem while it's still small and can be treated. You need to: Check your breasts often. Tell your doctor about any changes. You should do breast self-exams even if you have breast implants. What you need: A mirror. A well-lit room. A pillow or other soft object. How to do a breast self-exam Look for changes  Take off all the clothes above your waist. Stand in front of a mirror in a room with good lighting. Put your hands down at your sides. Compare your breasts in the mirror. Look for difference between them, such as: Differences in shape. Differences in size. Wrinkles, dips, and bumps in one breast and not the other. Look at each breast for skin changes, such as: Redness. Scaly spots. Spots where your skin is thicker. Dimpling. Open sores. Look  for changes in your nipples, such as: Fluid coming out of a nipple. Fluid around a nipple. Bleeding. Dimpling. Redness. A nipple that looks pushed in or that has changed position. Feel for changes Lie on your back. Feel each breast. To do this: Pick a breast to feel. Place a pillow under the shoulder closest to that breast. Put the arm closest to that breast behind your head. Feel the breast using the hand of your other arm. Use the pads of your three middle fingers to make small circles starting near the nipple. Use light, medium, and firm pressure. Keep making circles, moving down over the breast. Stop when you feel your ribs. Start making circles with your fingers again, this time going up until you reach your collarbone. Then, make circles out across your breast and into your armpit area. Squeeze your nipple. Check for fluid and lumps. Do these steps again to check your other breast. Sit or stand in the tub or shower. With soapy water on your skin, feel each breast the same way you did when you were lying down. Write down what you find Writing down what you find can help you keep track of what you want to tell your doctor. Write down: What's normal for each breast. Any changes you find. Write down: The kind of change. If your breast feels tender or painful. Any lump you find. Write  down its size and where it is. When you last had your period. General tips If you're breastfeeding, the best time to check your breasts is after you feed your baby or after you use a breast pump. If you get a period, the best time to check your breasts is 5-7 days after your period ends. With time, you'll get more used to doing the self-exam. You'll also start to know if there are changes in your breasts. Contact a doctor if: You see a change in the shape or size of your breasts or nipples. You see a change in the skin of your breast or nipples. You have fluid coming from your nipples that isn't  normal. You find a new lump or thick area. You have breast pain. You have any concerns about your breast health. This information is not intended to replace advice given to you by your health care provider. Make sure you discuss any questions you have with your health care provider. Document Revised: 12/10/2023 Document Reviewed: 12/10/2023 Elsevier Patient Education  2025 [employer]. Preventive Care 54-39 Years Old, Female Preventive care refers to lifestyle choices and visits with your health care provider that can promote health and wellness. Preventive care visits are also called wellness exams. What can I expect for my preventive care visit? Counseling During your preventive care visit, your health care provider may ask about your: Medical history, including: Past medical problems. Family medical history. Pregnancy history. Current health, including: Menstrual cycle. Method of birth control. Emotional well-being. Home life and relationship well-being. Sexual activity and sexual health. Lifestyle, including: Alcohol, nicotine or tobacco, and drug use. Access to firearms. Diet, exercise, and sleep habits. Work and work Astronomer. Sunscreen use. Safety issues such as seatbelt and bike helmet use. Physical exam Your health care provider may check your: Height and weight. These may be used to calculate your BMI (body mass index). BMI is a measurement that tells if you are at a healthy weight. Waist circumference. This measures the distance around your waistline. This measurement also tells if you are at a healthy weight and may help predict your risk of certain diseases, such as type 2 diabetes and high blood pressure. Heart rate and blood pressure. Body temperature. Skin for abnormal spots. What immunizations do I need?  Vaccines are usually given at various ages, according to a schedule. Your health care provider will recommend vaccines for you based on your age, medical  history, and lifestyle or other factors, such as travel or where you work. What tests do I need? Screening Your health care provider may recommend screening tests for certain conditions. This may include: Pelvic exam and Pap test. Lipid and cholesterol levels. Diabetes screening. This is done by checking your blood sugar (glucose) after you have not eaten for a while (fasting). Hepatitis B test. Hepatitis C test. HIV (human immunodeficiency virus) test. STI (sexually transmitted infection) testing, if you are at risk. BRCA-related cancer screening. This may be done if you have a family history of breast, ovarian, tubal, or peritoneal cancers. Talk with your health care provider about your test results, treatment options, and if necessary, the need for more tests. Follow these instructions at home: Eating and drinking  Eat a healthy diet that includes fresh fruits and vegetables, whole grains, lean protein, and low-fat dairy products. Take vitamin and mineral supplements as recommended by your health care provider. Do not drink alcohol if: Your health care provider tells you not to drink. You are pregnant, may be  pregnant, or are planning to become pregnant. If you drink alcohol: Limit how much you have to 0-1 drink a day. Know how much alcohol is in your drink. In the [place] one drink equals one 12 oz bottle of beer (355 mL), one 5 oz glass of wine (148 mL), or one 1 oz glass of hard liquor (44 mL). Lifestyle Brush your teeth every morning and night with fluoride toothpaste. Floss one time each day. Exercise for at least 30 minutes 5 or more days each week. Do not use any products that contain nicotine or tobacco. These products include cigarettes, chewing tobacco, and vaping devices, such as e-cigarettes. If you need help quitting, ask your health care provider. Do not use drugs. If you are sexually active, practice safe sex. Use a condom or other form of protection to prevent STIs. If  you do not wish to become pregnant, use a form of birth control. If you plan to become pregnant, see your health care provider for a prepregnancy visit. Find healthy ways to manage stress, such as: Meditation, yoga, or listening to music. Journaling. Talking to a trusted person. Spending time with friends and family. Minimize exposure to UV radiation to reduce your risk of skin cancer. Safety Always wear your seat belt while driving or riding in a vehicle. Do not drive: If you have been drinking alcohol. Do not ride with someone who has been drinking. If you have been using any mind-altering substances or drugs. While texting. When you are tired or distracted. Wear a helmet and other protective equipment during sports activities. If you have firearms in your house, make sure you follow all gun safety procedures. Seek help if you have been physically or sexually abused. What's next? Go to your health care provider once a year for an annual wellness visit. Ask your health care provider how often you should have your eyes and teeth checked. Stay up to date on all vaccines. This information is not intended to replace advice given to you by your health care provider. Make sure you discuss any questions you have with your health care provider. Document Revised: 03/28/2021 Document Reviewed: 03/28/2021 Elsevier Patient Education  2024 [employer].

## 2024-08-09 ENCOUNTER — Encounter: Payer: Self-pay | Admitting: Certified Nurse Midwife

## 2024-08-09 ENCOUNTER — Other Ambulatory Visit (HOSPITAL_COMMUNITY)
Admission: RE | Admit: 2024-08-09 | Discharge: 2024-08-09 | Disposition: A | Source: Ambulatory Visit | Attending: Certified Nurse Midwife | Admitting: Certified Nurse Midwife

## 2024-08-09 ENCOUNTER — Ambulatory Visit: Admitting: Certified Nurse Midwife

## 2024-08-09 VITALS — BP 107/67 | HR 69 | Ht 65.0 in | Wt 137.2 lb

## 2024-08-09 DIAGNOSIS — Z113 Encounter for screening for infections with a predominantly sexual mode of transmission: Secondary | ICD-10-CM | POA: Diagnosis present

## 2024-08-09 DIAGNOSIS — Z01419 Encounter for gynecological examination (general) (routine) without abnormal findings: Secondary | ICD-10-CM | POA: Insufficient documentation

## 2024-08-09 DIAGNOSIS — Z9189 Other specified personal risk factors, not elsewhere classified: Secondary | ICD-10-CM

## 2024-08-09 DIAGNOSIS — Z124 Encounter for screening for malignant neoplasm of cervix: Secondary | ICD-10-CM

## 2024-08-09 DIAGNOSIS — Z1151 Encounter for screening for human papillomavirus (HPV): Secondary | ICD-10-CM

## 2024-08-09 DIAGNOSIS — Z803 Family history of malignant neoplasm of breast: Secondary | ICD-10-CM | POA: Diagnosis not present

## 2024-08-09 DIAGNOSIS — Z1231 Encounter for screening mammogram for malignant neoplasm of breast: Secondary | ICD-10-CM

## 2024-08-09 DIAGNOSIS — Z114 Encounter for screening for human immunodeficiency virus [HIV]: Secondary | ICD-10-CM

## 2024-08-10 LAB — RPR: RPR Ser Ql: NONREACTIVE

## 2024-08-10 LAB — HIV ANTIBODY (ROUTINE TESTING W REFLEX): HIV Screen 4th Generation wRfx: NONREACTIVE

## 2024-08-11 ENCOUNTER — Ambulatory Visit: Payer: Self-pay | Admitting: Certified Nurse Midwife

## 2024-08-11 LAB — CERVICOVAGINAL ANCILLARY ONLY
Bacterial Vaginitis (gardnerella): NEGATIVE
Candida Glabrata: NEGATIVE
Candida Vaginitis: NEGATIVE
Chlamydia: NEGATIVE
Comment: NEGATIVE
Comment: NEGATIVE
Comment: NEGATIVE
Comment: NEGATIVE
Comment: NEGATIVE
Comment: NORMAL
Neisseria Gonorrhea: NEGATIVE
Trichomonas: NEGATIVE

## 2024-08-16 LAB — CYTOLOGY - PAP
Comment: NEGATIVE
Diagnosis: UNDETERMINED — AB
High risk HPV: NEGATIVE

## 2024-10-19 ENCOUNTER — Encounter
